# Patient Record
Sex: Female | Born: 1990 | Race: White | Hispanic: No | Marital: Single | Smoking: Never smoker
Health system: Southern US, Community
[De-identification: ages and names within clinical notes are randomized; demographics above are authoritative.]

## PROBLEM LIST (undated history)

## (undated) DIAGNOSIS — J358 Other chronic diseases of tonsils and adenoids: Secondary | ICD-10-CM

## (undated) DIAGNOSIS — N390 Urinary tract infection, site not specified: Secondary | ICD-10-CM

## (undated) DIAGNOSIS — F419 Anxiety disorder, unspecified: Secondary | ICD-10-CM

## (undated) DIAGNOSIS — J3501 Chronic tonsillitis: Secondary | ICD-10-CM

## (undated) DIAGNOSIS — J302 Other seasonal allergic rhinitis: Secondary | ICD-10-CM

## (undated) DIAGNOSIS — Z98811 Dental restoration status: Secondary | ICD-10-CM

## (undated) DIAGNOSIS — F988 Other specified behavioral and emotional disorders with onset usually occurring in childhood and adolescence: Secondary | ICD-10-CM

## (undated) HISTORY — PX: WISDOM TOOTH EXTRACTION: SHX21

---
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
## 2009-04-21 ENCOUNTER — Ambulatory Visit: Payer: Self-pay | Admitting: Family Medicine

## 2009-05-02 ENCOUNTER — Other Ambulatory Visit: Admission: RE | Admit: 2009-05-02 | Discharge: 2009-05-02 | Payer: Self-pay | Admitting: Family Medicine

## 2009-05-02 ENCOUNTER — Encounter: Payer: Self-pay | Admitting: Family Medicine

## 2009-05-02 ENCOUNTER — Ambulatory Visit: Payer: Self-pay | Admitting: Family Medicine

## 2009-05-09 ENCOUNTER — Telehealth: Payer: Self-pay | Admitting: *Deleted

## 2009-06-30 ENCOUNTER — Other Ambulatory Visit: Admission: RE | Admit: 2009-06-30 | Discharge: 2009-06-30 | Payer: Self-pay | Admitting: Family Medicine

## 2009-06-30 ENCOUNTER — Ambulatory Visit: Payer: Self-pay | Admitting: Family Medicine

## 2009-06-30 ENCOUNTER — Encounter: Payer: Self-pay | Admitting: Family Medicine

## 2009-07-05 ENCOUNTER — Telehealth: Payer: Self-pay | Admitting: Family Medicine

## 2009-07-20 ENCOUNTER — Encounter: Payer: Self-pay | Admitting: Family Medicine

## 2009-11-13 ENCOUNTER — Telehealth: Payer: Self-pay | Admitting: Family Medicine

## 2009-11-14 ENCOUNTER — Ambulatory Visit: Payer: Self-pay | Admitting: Family Medicine

## 2009-11-14 DIAGNOSIS — F410 Panic disorder [episodic paroxysmal anxiety] without agoraphobia: Secondary | ICD-10-CM | POA: Insufficient documentation

## 2009-12-07 ENCOUNTER — Ambulatory Visit: Payer: Self-pay | Admitting: Family Medicine

## 2009-12-07 DIAGNOSIS — L559 Sunburn, unspecified: Secondary | ICD-10-CM | POA: Insufficient documentation

## 2010-01-18 ENCOUNTER — Telehealth: Payer: Self-pay | Admitting: Family Medicine

## 2010-03-30 ENCOUNTER — Ambulatory Visit: Payer: Self-pay | Admitting: Family Medicine

## 2010-03-30 DIAGNOSIS — N938 Other specified abnormal uterine and vaginal bleeding: Secondary | ICD-10-CM | POA: Insufficient documentation

## 2010-05-23 ENCOUNTER — Ambulatory Visit: Payer: Self-pay | Admitting: Family Medicine

## 2010-05-31 ENCOUNTER — Other Ambulatory Visit: Admission: RE | Admit: 2010-05-31 | Discharge: 2010-05-31 | Payer: Self-pay | Admitting: Family Medicine

## 2010-05-31 ENCOUNTER — Ambulatory Visit: Payer: Self-pay | Admitting: Family Medicine

## 2010-05-31 LAB — CONVERTED CEMR LAB: Pap Smear: UNDETERMINED

## 2010-06-04 ENCOUNTER — Telehealth: Payer: Self-pay | Admitting: Family Medicine

## 2010-06-05 ENCOUNTER — Telehealth: Payer: Self-pay | Admitting: Family Medicine

## 2010-06-26 ENCOUNTER — Telehealth: Payer: Self-pay | Admitting: Family Medicine

## 2010-07-31 ENCOUNTER — Telehealth: Payer: Self-pay | Admitting: Family Medicine

## 2010-09-28 ENCOUNTER — Other Ambulatory Visit
Admission: RE | Admit: 2010-09-28 | Discharge: 2010-09-28 | Payer: Self-pay | Source: Home / Self Care | Admitting: Family Medicine

## 2010-09-28 ENCOUNTER — Ambulatory Visit
Admission: RE | Admit: 2010-09-28 | Discharge: 2010-09-28 | Payer: Self-pay | Source: Home / Self Care | Attending: Family Medicine | Admitting: Family Medicine

## 2010-09-28 DIAGNOSIS — R87610 Atypical squamous cells of undetermined significance on cytologic smear of cervix (ASC-US): Secondary | ICD-10-CM | POA: Insufficient documentation

## 2010-09-28 LAB — HM PAP SMEAR

## 2010-10-19 ENCOUNTER — Ambulatory Visit
Admission: RE | Admit: 2010-10-19 | Discharge: 2010-10-19 | Payer: Self-pay | Source: Home / Self Care | Attending: Family Medicine | Admitting: Family Medicine

## 2010-10-19 DIAGNOSIS — N390 Urinary tract infection, site not specified: Secondary | ICD-10-CM | POA: Insufficient documentation

## 2010-10-19 LAB — CONVERTED CEMR LAB
Bilirubin Urine: NEGATIVE
Glucose, Urine, Semiquant: NEGATIVE
Specific Gravity, Urine: 1.015
Urobilinogen, UA: 0.2
pH: 7.5

## 2010-10-23 NOTE — Progress Notes (Signed)
Summary: UTI in [place]  Phone Note Call from Patient   Caller: Patient Call For: Roderick Pee MD Summary of Call: Pt is is in [place] with hematuria and dysuria.  Advised to see a MD there for UA and appropriate treatment. Initial call taken by: Lynann Beaver CMA,  June 26, 2010 9:14 AM  Follow-up for Phone Call       Follow-up by: Roderick Pee MD,  June 26, 2010 9:18 AM

## 2010-10-23 NOTE — Progress Notes (Signed)
Summary: ringworm?  Phone Note Call from Patient   Caller: Patient Call For: Roderick Pee MD Summary of Call: [phone] symptoms of ringworm Initial call taken by: Lynann Beaver CMA,  January 18, 2010 3:41 PM  Follow-up for Phone Call        ring shaped patches and itching on body.  Is [facility].  Prob. ringworm..has been around a dog that lives outdoors. Follow-up by: Lynann Beaver CMA,  January 18, 2010 4:23 PM  Additional Follow-up for Phone Call Additional follow up Details #1::        please call have her come to the office this afternoon for evaluation as needed Additional Follow-up by: Roderick Pee MD,  Jan 22, 2010 7:35 AM    Additional Follow-up for Phone Call Additional follow up Details #2::    left message on machine for patient  Follow-up by: Kern Reap CMA Duncan Dull),  Jan 22, 2010 9:07 AM
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
## 2010-10-23 NOTE — Assessment & Plan Note (Signed)
Summary: pap only//ccm   History of Present Illness: Kristie Fitzgerald is a 20 year old single female, who comes in today for a Pap smear because we did her physical last week.  She was having her cycle.  Pelvic exam normal.  Pap smear sent for analysis.  She is also having trouble sleeping dysfunction.  She, states she's taken Tylenol PM for a couple years now is not working.  She denies any history of depression.  She states she just goes to bed, but can't go to sleep when she goes to sleep.  She sleeps ok   She states her mother has the same problem  Allergies: No Known Drug Allergies   Complete Medication List: 1)  Celexa 20 Mg Tabs (Citalopram hydrobromide) .Marland Kitchen.. 1 tab @ bedtime 2)  Ortho-cyclen (28) 0.25-35 Mg-mcg Tabs (Norgestimate-eth estradiol) .... Uad 3)  Amitriptyline Hcl 10 Mg Tabs (Amitriptyline hcl) .Marland Kitchen.. 1 tab @ bedtime  Other Orders: No Charge Patient Arrived (NCPA0) (NCPA0) Prescriptions: AMITRIPTYLINE HCL 10 MG TABS (AMITRIPTYLINE HCL) 1 tab @ bedtime  #100 x 1   Entered and Authorized by:   Roderick Pee MD   Signed by:   Roderick Pee MD on 05/31/2010   Method used:     RxID:   [id number]

## 2010-10-23 NOTE — Assessment & Plan Note (Signed)
Summary: cpx/pap/njr   Vital Signs:  Patient profile:   20 year old female Menstrual status:  regular LMP:     05/17/2010 Height:      67.75 inches Weight:      156 pounds BMI:     23.98 Temp:     97.8 degrees F oral BP sitting:   100 / 70  (left arm) Cuff size:   regular  Vitals Entered By: Kathrynn Speed CMA (May 23, 2010 9:20 AM) CC: cpx,  no pap, src LMP (date): 05/17/2010     Enter LMP: 05/17/2010 Last PAP Result NEGATIVE FOR INTRAEPITHELIAL LESIONS OR MALIGNANCY. Flu Vaccine Consent Questions     Do you have a history of severe allergic reactions to this vaccine? no    Any prior history of allergic reactions to egg and/or gelatin? no    Do you have a sensitivity to the preservative Thimersol? no    Do you have a past history of Guillan-Barre Syndrome? no    Do you currently have an acute febrile illness? no    Have you ever had a severe reaction to latex? no    Vaccine information given and explained to patient? yes    Are you currently pregnant? no    Lot Number:[id number]   Exp Date:03/23/2011   Site Given  Left Deltoid IM   CC:  cpx, no pap, and src.  History of Present Illness: Kristie Fitzgerald is a 20 year old single female, who comes in today for general physical examination  She takes Celexa 20 mg nightly for mild panic disorder..  Doing well.  She also takes her BCPs.  Started the other day.  Not done.  Therefore, have her come back in a week.  Review of systems negative...she does not do BSE monthly advised to do so and both nipples are inverted.  Normal for her  Family history reviewed.  Unchanged........... father has had a melanoma.  Tetanus 2003, seasonal flu shot today.  Meningitis 2009.  She leaves  September, the 13th for 3 months in [place]  Current Medications (verified): 1)  Celexa 20 Mg Tabs (Citalopram Hydrobromide) .Marland Kitchen.. 1 Tab @ Bedtime 2)  Ortho-Cyclen (28) 0.25-35 Mg-Mcg Tabs (Norgestimate-Eth Estradiol) .... Uad  Allergies (verified): No  Known Drug Allergies  Past History:  Past medical, surgical, family and social histories (including risk factors) reviewed, and no changes noted (except as noted below).  Past Medical History: Reviewed history from 06/23/2008 and no changes required. Unremarkable  Family History: Reviewed history from 11/14/2009 and no changes required. father of melanoma mother in good health Family History Depression  Social History: Reviewed history from 12/15/2007 and no changes required. Single Never Smoked Alcohol use-no Drug use-no Regular exercise-yes  Review of Systems      See HPI  Physical Exam  General:  Well-developed,well-nourished,in no acute distress; alert,appropriate and cooperative throughout examination Head:  Normocephalic and atraumatic without obvious abnormalities. No apparent alopecia or balding. Eyes:  No corneal or conjunctival inflammation noted. EOMI. Perrla. Funduscopic exam benign, without hemorrhages, exudates or papilledema. Vision grossly normal. Ears:  External ear exam shows no significant lesions or deformities.  Otoscopic examination reveals clear canals, tympanic membranes are intact bilaterally without bulging, retraction, inflammation or discharge. Hearing is grossly normal bilaterally. Nose:  External nasal examination shows no deformity or inflammation. Nasal mucosa are pink and moist without lesions or exudates. Mouth:  Oral mucosa and oropharynx without lesions or exudates.  Teeth in good repair. Neck:  No deformities, masses, or tenderness noted.  Chest Wall:  No deformities, masses, or tenderness noted. Breasts:  No mass, nodules, thickening, tenderness, bulging, retraction, inflamation, nipple discharge or skin changes noted.   Lungs:  Normal respiratory effort, chest expands symmetrically. Lungs are clear to auscultation, no crackles or wheezes. Heart:  Normal rate and regular rhythm. S1 and S2 normal without gallop, murmur, click, rub or other  extra sounds. Abdomen:  Bowel sounds positive,abdomen soft and non-tender without masses, organomegaly or hernias noted. Genitalia:  Pelvic Exam:        External: normal female genitalia without lesions or masses        Vagina: normal without lesions or masses        Cervix: normal without lesions or masses        Adnexa: normal bimanual exam without masses or fullness.........done a week after physical examination because at the time of her physical.  She was having her menstrual cycle        Uterus: normal by palpation        Pap smear: performed Msk:  No deformity or scoliosis noted of thoracic or lumbar spine.   Pulses:  R and L carotid,radial,femoral,dorsalis pedis and posterior tibial pulses are full and equal bilaterally Extremities:  No clubbing, cyanosis, edema, or deformity noted with normal full range of motion of all joints.   Neurologic:  No cranial nerve deficits noted. Station and gait are normal. Plantar reflexes are down-going bilaterally. DTRs are symmetrical throughout. Sensory, motor and coordinative functions appear intact. Skin:  Intact without suspicious lesions or rashes Cervical Nodes:  No lymphadenopathy noted Axillary Nodes:  No palpable lymphadenopathy Inguinal Nodes:  No significant adenopathy Psych:  Cognition and judgment appear intact. Alert and cooperative with normal attention span and concentration. No apparent delusions, illusions, hallucinations   Impression & Recommendations:  Problem # 1:  DYSFUNCTIONAL UTERINE BLEEDING (ICD-626.8) Assessment Improved  Problem # 2:  PANIC DISORDER (ICD-300.01) Assessment: Improved  Her updated medication list for this problem includes:    Celexa 20 Mg Tabs (Citalopram hydrobromide) .Marland Kitchen... 1 tab @ bedtime  Problem # 3:  Preventive Health Care (ICD-V70.0) Assessment: Unchanged  Complete Medication List: 1)  Celexa 20 Mg Tabs (Citalopram hydrobromide) .Marland Kitchen.. 1 tab @ bedtime 2)  Ortho-cyclen (28) 0.25-35 Mg-mcg Tabs  (Norgestimate-eth estradiol) .... Uad  Other Orders: Admin 1st Vaccine (16109) Flu Vaccine 19yrs + (60454) Prescriptions: ORTHO-CYCLEN (28) 0.25-35 MG-MCG TABS (NORGESTIMATE-ETH ESTRADIOL) UAD  #3 x 1   Entered and Authorized by:   Roderick Pee MD   Signed by:   Roderick Pee MD on 05/23/2010   Method used:   Print then Give to Patient   RxID:   [id number] CELEXA 20 MG TABS (CITALOPRAM HYDROBROMIDE) 1 tab @ bedtime  #100 x 3   Entered and Authorized by:   Roderick Pee MD   Signed by:   Roderick Pee MD on 05/23/2010   Method used:   Print then Give to Patient   RxID:   [id number]    Immunization History:  Tetanus/Td Immunization History:    Tetanus/Td:  historical (06/11/2002)

## 2010-10-23 NOTE — Assessment & Plan Note (Signed)
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  She had anxiety attacks as a teenager and then it went away.  About two months ago.  They started again.  She has episodes where she gets hot, rapid heart rate, sweaty, sometimes to the point of nausea and feeling like vomiting.  She's also having trouble with her 5 roommates.  She has the same boyfriend.  She had last year.  She's on her BCPs periods are normal  Allergies: No Known Drug Allergies  Past History:  Past medical, surgical, family and social histories (including risk factors) reviewed for relevance to current acute and chronic problems.  Past Medical History: Reviewed history from 06/23/2008 and no changes required. Unremarkable  Family History: Reviewed history from 12/15/2007 and no changes required. father of melanoma mother in good health Family History Depression  Social History: Reviewed history from 12/15/2007 and no changes required. Single Never Smoked Alcohol use-no Drug use-no Regular exercise-yes  Review of Systems      See HPI  Physical Exam  General:  Well-developed,well-nourished,in no acute distress; alert,appropriate and cooperative throughout examination Psych:  Cognition and judgment appear intact. Alert and cooperative with normal attention span and concentration. No apparent delusions, illusions, hallucinations   Impression & Recommendations:  Problem # 1:  PANIC DISORDER (ICD-300.01) Assessment New  Her updated medication list  for this problem includes:    Celexa 20 Mg Tabs (Citalopram hydrobromide) .Marland Kitchen... 1 tab @ bedtime  Orders: Prescription Created Electronically [id number])  Complete Medication List: 1)  Zovia 1/35e (28) 1-35 Mg-mcg Tabs (Ethynodiol diac-eth estradiol) .... Uad 2)  Celexa 20 Mg Tabs (Citalopram hydrobromide) .Marland Kitchen.. 1 tab @ bedtime  Patient Instructions: 1)  begin Celexa 20 mg a day at bedtime.  Return in 3 weeks for follow-up............. schedule a 30 minute appointment, so we can also check y skin  Prescriptions: CELEXA 20 MG TABS (CITALOPRAM HYDROBROMIDE) 1 tab @ bedtime  #100 x 3   Entered and Authorized by:   Roderick Pee MD   Signed by:   Roderick Pee MD on 11/14/2009   Method used:   Electronically to        [facility]* (retail)       [address]       [place], [place]  [address]       Ph: [phone]       Fax: [phone]   RxID:   [id number]

## 2010-10-23 NOTE — Progress Notes (Signed)
  Phone Note Outgoing Call   Summary of Call: I called [facility] and explained the nature of the abnormality and the fact that we need to re-check her every 3 months to be sure.  Her cervix completely heals.  She understands and will return in December Initial call taken by: Roderick Pee MD,  June 04, 2010 4:28 PM
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
## 2010-10-23 NOTE — Progress Notes (Signed)
Summary: Pt having anxiety issues. Wants referral to counselor  Call back at [phone] [name] cellphone   Caller: [name] Summary of Call: Pts mom called and said that her daughter said that she would like to talk to a counselor re: anxiety issue. What would Dr. Tawanna Cooler recommend?  Initial call taken by: Lucy Antigua,  November 13, 2009 3:56 PM    Additional Follow-up for Phone Call Additional follow up Details #2::    [name] is having symptoms of panic attacks advised to come to the office tomorrow at 10 a.m. for even a Follow-up by: Roderick Pee MD,  November 13, 2009 5:21 PM

## 2010-10-23 NOTE — Progress Notes (Signed)
Summary: possible strep  Phone Note Call from Patient   Caller: Mom Call For: Roderick Pee MD Reason for Call: Talk to Doctor Summary of Call: patient's mom is calling becaues [name] is calling with a red, swollen, sore throat.  She believes it may be strep.  However Kristie Fitzgerald is in [place] and was wondering if you had any suggestions?  Mom number is [phone] and Kristie Fitzgerald can be reached on her cell phone at [phone]. Initial call taken by: Kern Reap CMA Duncan Dull),  July 31, 2010 10:00 AM  Follow-up for Phone Call        [name] please call mag and then tell her to go to a local urgent care in [place]  for a strep test  Follow-up by: Roderick Pee MD,  July 31, 2010 10:47 AM  Additional Follow-up for Phone Call Additional follow up Details #1::        Phone Call Completed Additional Follow-up by: Kern Reap CMA Duncan Dull),  July 31, 2010 12:45 PM

## 2010-10-25 NOTE — Assessment & Plan Note (Signed)
Summary: 3 week fup---recheck urine/ccm   Vital Signs:  Patient profile:   20 year old female Menstrual status:  regular Weight:      156 pounds Temp:     98.3 degrees F oral BP sitting:   110 / 76  (left arm) Cuff size:   regular  Vitals Entered By: Kern Reap CMA Duncan Dull) (October 19, 2010 4:52 PM) CC: follow-up visit for UTI   CC:  follow-up visit for UTI.  History of Present Illness: Kristie Fitzgerald is a 20 year old single female, who comes in today for evaluation of 3 problems.  We increased her Celexa from 20 mg a day to 40 because she was having issues with anxiety.  She feels much better and wishes to continue that dose.  We gave her Septra for a week because of hematuria.  The hematuria persists, asymptomatic pain.  She thinks she may have cut herself shaving.  However, on physical exam, I can see no cuts, and her urethra looks normal.  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  We discussed the various options.  I explained to her that her to turbid.  This would heal on its down however, we need to monitor her carefully  Allergies: No Known Drug Allergies  Past History:  Past medical, surgical, family and social histories (including risk factors) reviewed for relevance to current acute and chronic problems.  Past Medical History: Reviewed history from 06/23/2008 and no changes required. Unremarkable  Family History: Reviewed history from 11/14/2009 and no changes required. father of melanoma mother in good health Family History Depression  Social History: Reviewed history from 12/15/2007 and no changes required. Single Never Smoked Alcohol use-no Drug use-no Regular exercise-yes  Review of Systems      See HPI  Physical Exam  General:  Well-developed,well-nourished,in no acute distress; alert,appropriate and cooperative throughout examination Genitalia:  extension into within normal limits.  The urethra looks  normal.   Impression & Recommendations:  Problem # 1:  UTI (ICD-599.0) Assessment Unchanged  Her updated medication list for this problem includes:    Septra Ds 800-160 Mg Tabs (Sulfamethoxazole-trimethoprim) .Marland Kitchen... 1 tab @ bedtime x 2 weeks    Ciprofloxacin Hcl 500 Mg Tabs (Ciprofloxacin hcl) .Marland Kitchen... 1 tab @ bedtime  Orders: UA Dipstick w/o Micro (manual) (16109)  Problem # 2:  ASCUS PAP (ICD-795.01) Assessment: Unchanged  Problem # 3:  PANIC DISORDER (ICD-300.01) Assessment: Improved  Her updated medication list for this problem includes:    Celexa 20 Mg Tabs (Citalopram hydrobromide) .Marland Kitchen... 1 tab @ bedtime    Amitriptyline Hcl 10 Mg Tabs (Amitriptyline hcl) .Marland Kitchen... 1 tab @ bedtime    Celexa 40 Mg Tabs (Citalopram hydrobromide) .Marland Kitchen... 1 tab @ bedtime  Complete Medication List: 1)  Celexa 20 Mg Tabs (Citalopram hydrobromide) .Marland Kitchen.. 1 tab @ bedtime 2)  Ortho-cyclen (28) 0.25-35 Mg-mcg Tabs (Norgestimate-eth estradiol) .... Uad 3)  Amitriptyline Hcl 10 Mg Tabs (Amitriptyline hcl) .Marland Kitchen.. 1 tab @ bedtime 4)  Celexa 40 Mg Tabs (Citalopram hydrobromide) .Marland Kitchen.. 1 tab @ bedtime 5)  Septra Ds 800-160 Mg Tabs (Sulfamethoxazole-trimethoprim) .Marland Kitchen.. 1 tab @ bedtime x 2 weeks 6)  Ciprofloxacin Hcl 500 Mg Tabs (Ciprofloxacin hcl) .Marland Kitchen.. 1 tab @ bedtime  Patient Instructions: 1)  take Cipro one tablet daily, x 3 weeks.  Return in 4 weeks for follow-up. 2)  Continue the Celexa 40 mg nightly 3)  Follow-up Pap smear in 6 months Prescriptions: CIPROFLOXACIN HCL 500 MG TABS (  CIPROFLOXACIN HCL) 1 tab @ bedtime  #21 x 0   Entered and Authorized by:   Roderick Pee MD   Signed by:   Roderick Pee MD on 10/19/2010   Method used:   Electronically to        [facility]* (retail)       [address]       [place], [place]  [address]       Ph: [phone]       Fax: [phone]   RxID:   [id number] CIPROFLOXACIN HCL 500 MG TABS (CIPROFLOXACIN HCL) 1 tab @ bedtime  #21 x 0   Entered and  Authorized by:   Roderick Pee MD   Signed by:   Roderick Pee MD on 10/19/2010   Method used:     RxID:   [id number]    Orders Added: 1)  UA Dipstick w/o Micro (manual) [81002] 2)  Est. Patient Level III [38756]    Laboratory Results   Urine Tests  Date/Time Received: October 19, 2010   Routine Urinalysis   Color: yellow Appearance: Clear Glucose: negative   (Normal Range: Negative) Bilirubin: negative   (Normal Range: Negative) Ketone: negative   (Normal Range: Negative) Spec. Gravity: 1.015   (Normal Range: 1.003-1.035) Blood: moderate   (Normal Range: Negative) pH: 7.5   (Normal Range: 5.0-8.0) Protein: negative   (Normal Range: Negative) Urobilinogen: 0.2   (Normal Range: 0-1) Nitrite: negative   (Normal Range: Negative) Leukocyte Esterace: negative   (Normal Range: Negative)    Comments: Kern Reap CMA Duncan Dull)  October 19, 2010 4:58 PM

## 2010-10-25 NOTE — Assessment & Plan Note (Signed)
Summary: pap only per dr//ccm   History of Present Illness: Kristie Fitzgerald is a 20 year old single female, nonsmoker, who comes in today for follow-up Pap smear.  Her Pap smear in September was normal except she had some slight squamous atypia.  Review of systems negative except she recently this past fall spent 3 months in [place].  She did well over there.  She developed a UTI in [place] and then had strep throat in [place].  Both of which were treated and she finished her medication.  Review of systems negative.  With her dad having melanoma.  We will also do a thorough skin exam  She is also having symptoms of frequency that come and go.  No fever, dysuria, etc.  Allergies: No Known Drug Allergies  Past History:  Past medical, surgical, family and social histories (including risk factors) reviewed for relevance to current acute and chronic problems.  Past Medical History: Reviewed history from 06/23/2008 and no changes required. Unremarkable  Family History: Reviewed history from 11/14/2009 and no changes required. father of melanoma mother in good health Family History Depression  Social History: Reviewed history from 12/15/2007 and no changes required. Single Never Smoked Alcohol use-no Drug use-no Regular exercise-yes  Review of Systems      See HPI  Physical Exam  General:  Well-developed,well-nourished,in no acute distress; alert,appropriate and cooperative throughout examination Genitalia:  Pelvic Exam:        External: normal female genitalia without lesions or masses        Vagina: normal without lesions or masses        Cervix: normal without lesions or masses        Adnexa: normal bimanual exam without masses or fullness        Uterus: normal by palpation        Pap smear: performed Skin:  total body skin exam normal   Impression & Recommendations:  Problem # 1:  ASCUS PAP (ICD-795.01) Assessment New  Complete Medication List: 1)  Celexa 20 Mg Tabs  (Citalopram hydrobromide) .Marland Kitchen.. 1 tab @ bedtime 2)  Ortho-cyclen (28) 0.25-35 Mg-mcg Tabs (Norgestimate-eth estradiol) .... Uad 3)  Amitriptyline Hcl 10 Mg Tabs (Amitriptyline hcl) .Marland Kitchen.. 1 tab @ bedtime 4)  Celexa 40 Mg Tabs (Citalopram hydrobromide) .Marland Kitchen.. 1 tab @ bedtime 5)  Septra Ds 800-160 Mg Tabs (Sulfamethoxazole-trimethoprim) .Marland Kitchen.. 1 tab @ bedtime x 2 weeks  Patient Instructions: 1)  I will call you when I get the report. 2)  Increase the Celexa to 40 mg nightly to see if this does not help with her sleep dysfunction. 3)  Begin Septra DS, one daily x 2 weeks...Marland KitchenMarland KitchenMarland Kitchen  Return in 3 weeks to recheck a urine Prescriptions: SEPTRA DS 800-160 MG TABS (SULFAMETHOXAZOLE-TRIMETHOPRIM) 1 tab @ bedtime x 2 weeks  #14 x 0   Entered and Authorized by:   Roderick Pee MD   Signed by:   Roderick Pee MD on 09/28/2010   Method used:     RxID:   [id number] CELEXA 40 MG TABS (CITALOPRAM HYDROBROMIDE) 1 tab @ bedtime  #100 x 3   Entered and Authorized by:   Roderick Pee MD   Signed by:   Roderick Pee MD on 09/28/2010   Method used:     RxID:   [id number]    Orders Added: 1)  Est. Patient Level IV [95284]

## 2010-11-19 ENCOUNTER — Encounter: Payer: Self-pay | Admitting: Family Medicine

## 2010-11-19 ENCOUNTER — Ambulatory Visit (INDEPENDENT_AMBULATORY_CARE_PROVIDER_SITE_OTHER): Payer: Commercial Managed Care - PPO | Admitting: Family Medicine

## 2010-11-19 DIAGNOSIS — F988 Other specified behavioral and emotional disorders with onset usually occurring in childhood and adolescence: Secondary | ICD-10-CM

## 2010-11-19 DIAGNOSIS — R319 Hematuria, unspecified: Secondary | ICD-10-CM

## 2010-11-19 LAB — POCT URINALYSIS DIPSTICK
Leukocytes, UA: NEGATIVE
Nitrite, UA: NEGATIVE
Protein, UA: NEGATIVE
Urobilinogen, UA: 0.2

## 2010-11-19 MED ORDER — LISDEXAMFETAMINE DIMESYLATE 20 MG PO CAPS: 20.0000 mg | ORAL_CAPSULE | ORAL | Status: DC

## 2010-11-19 NOTE — Progress Notes (Signed)
  Subjective:    Patient ID: [name], female    DOB: Jul 09, 1991, 20 y.o.   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  She had a bladder infection and persistent hematuria.  Hematuria, now is resolved.  In the past year.  She is having trouble focusing concentration.  She took one of her friends vyvanse  and it really helped.  Neither mother nor father had ADD or ADHD.  The g PA a slip to 2.9.  She is a premed major psych major and history minor.   Review of Systems    neg  Objective:   Physical Exam    Well-developed well-nourished, female, in no acute distress    Assessment & Plan:  Hematuria resolved.  Question ADD,,,,,,,,,,,,, plan trial of vyvanse   Follow-up in two weeks

## 2010-11-19 NOTE — Patient Instructions (Signed)
Begin vyvanse 20 mg q.a.m. Follow-up in two weeks

## 2010-11-29 ENCOUNTER — Other Ambulatory Visit: Payer: Self-pay | Admitting: Family Medicine

## 2010-12-04 ENCOUNTER — Ambulatory Visit: Payer: Commercial Managed Care - PPO | Admitting: Family Medicine

## 2010-12-21 ENCOUNTER — Encounter: Payer: Self-pay | Admitting: Family Medicine

## 2010-12-24 ENCOUNTER — Ambulatory Visit (INDEPENDENT_AMBULATORY_CARE_PROVIDER_SITE_OTHER): Payer: 59 | Admitting: Family Medicine

## 2010-12-24 VITALS — BP 110/72 | Temp 98.3°F | Wt 156.0 lb

## 2010-12-24 DIAGNOSIS — F909 Attention-deficit hyperactivity disorder, unspecified type: Secondary | ICD-10-CM

## 2010-12-24 MED ORDER — LISDEXAMFETAMINE DIMESYLATE 20 MG PO CAPS: ORAL_CAPSULE | ORAL | Status: DC

## 2010-12-24 MED ORDER — METHYLPHENIDATE HCL 10 MG PO TABS: ORAL_TABLET | ORAL | Status: DC

## 2010-12-24 MED ORDER — LISDEXAMFETAMINE DIMESYLATE 20 MG PO CAPS: 20.0000 mg | ORAL_CAPSULE | ORAL | Status: DC

## 2010-12-24 NOTE — Patient Instructions (Signed)
Continued the morning, medication and add the 10-mg tablet in the afternoon, as needed.  Set up a time in July, mid cycles for general physical exam

## 2010-12-24 NOTE — Progress Notes (Signed)
  Subjective:    Patient ID: [name], female    DOB: 1991/07/01, 20 y.o.   MRN: [id number]  HPI Clemmie is a 20 year old female, nonsmoker, who comes in today for follow-up of ADD.  We start her on vyvanse 20 mg q.a.m., and it's helped her tremendously.  She can now focusing concentrating at her work done.  It tends to wear off after 4 to 5  Hours.She's having breakthrough bleeding from her BCPs.  We discussed how to make it go away.        Review of Systems General and psychiatric review of systems otherwise negative    Objective:   Physical Exam With the primary team in no acute distress       Assessment & Plan:  Adult ADD,,,,,,,, continue the Vyvanse 20 mg q.a.m. Add a 10 mg supplement p.r.n.  Return in July at mid cycle for your annual physical examination

## 2011-01-18 ENCOUNTER — Other Ambulatory Visit: Payer: Self-pay | Admitting: Family Medicine

## 2011-03-12 ENCOUNTER — Telehealth: Payer: Self-pay | Admitting: *Deleted

## 2011-03-12 NOTE — Telephone Encounter (Signed)
Decrease Celexa to 20 mg daily, dispense 100 tabs no refills.  She's coming in August for a checkup or she can take her 62s and just cut them in half

## 2011-03-12 NOTE — Telephone Encounter (Signed)
patient  Is aware and refill not needed at this time

## 2011-03-12 NOTE — Telephone Encounter (Signed)
Pt requesting to have Celexa 40 mg switched to 20 mg dosage.  She has been feeling more anxious and kind of hazy and prefers to switch back to original dosage.  Ok to leave message on voicemail in class until 4pm.

## 2011-03-16 ENCOUNTER — Other Ambulatory Visit: Payer: Self-pay | Admitting: Family Medicine

## 2011-04-04 ENCOUNTER — Encounter: Payer: 59 | Admitting: Family Medicine

## 2011-04-05 ENCOUNTER — Other Ambulatory Visit (HOSPITAL_COMMUNITY)
Admission: RE | Admit: 2011-04-05 | Discharge: 2011-04-05 | Disposition: A | Discharge status: Home / Self Care | Payer: 59 | Source: Ambulatory Visit | Attending: Family Medicine | Admitting: Family Medicine

## 2011-04-05 ENCOUNTER — Ambulatory Visit (INDEPENDENT_AMBULATORY_CARE_PROVIDER_SITE_OTHER): Payer: 59 | Admitting: Family Medicine

## 2011-04-05 ENCOUNTER — Encounter: Payer: Self-pay | Admitting: Family Medicine

## 2011-04-05 VITALS — BP 118/78 | Temp 98.5°F | Ht 67.75 in | Wt 163.0 lb

## 2011-04-05 DIAGNOSIS — N938 Other specified abnormal uterine and vaginal bleeding: Secondary | ICD-10-CM

## 2011-04-05 DIAGNOSIS — N949 Unspecified condition associated with female genital organs and menstrual cycle: Secondary | ICD-10-CM

## 2011-04-05 DIAGNOSIS — N925 Other specified irregular menstruation: Secondary | ICD-10-CM

## 2011-04-05 DIAGNOSIS — Z113 Encounter for screening for infections with a predominantly sexual mode of transmission: Secondary | ICD-10-CM | POA: Insufficient documentation

## 2011-04-05 DIAGNOSIS — Z01419 Encounter for gynecological examination (general) (routine) without abnormal findings: Secondary | ICD-10-CM

## 2011-04-05 DIAGNOSIS — Z124 Encounter for screening for malignant neoplasm of cervix: Secondary | ICD-10-CM | POA: Insufficient documentation

## 2011-04-05 DIAGNOSIS — F909 Attention-deficit hyperactivity disorder, unspecified type: Secondary | ICD-10-CM

## 2011-04-05 MED ORDER — LISDEXAMFETAMINE DIMESYLATE 20 MG PO CAPS: ORAL_CAPSULE | ORAL | Status: DC

## 2011-04-05 MED ORDER — METHYLPHENIDATE HCL 10 MG PO TABS: 10.0000 mg | ORAL_TABLET | Freq: Every day | ORAL | Status: DC

## 2011-04-05 MED ORDER — NORGESTIMATE-ETH ESTRADIOL 0.25-35 MG-MCG PO TABS: 1.0000 | ORAL_TABLET | Freq: Every day | ORAL | Status: DC

## 2011-04-05 MED ORDER — CITALOPRAM HYDROBROMIDE 20 MG PO TABS: 20.0000 mg | ORAL_TABLET | Freq: Every day | ORAL | Status: DC

## 2011-04-05 MED ORDER — LISDEXAMFETAMINE DIMESYLATE 20 MG PO CAPS: 20.0000 mg | ORAL_CAPSULE | ORAL | Status: DC

## 2011-04-05 NOTE — Patient Instructions (Signed)
Continue your current medications.  Follow-up in one year, sooner if any problems.  Sunscreens SPF 50.  Remember to do a breast exam and skin exam monthly

## 2011-04-05 NOTE — Progress Notes (Signed)
  Subjective:    Patient ID: Kristie Fitzgerald, female    DOB: [birth year], 20 y.o.   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  This helps prevent panic attacks.  She also takes vyvanse 20 mg q.a.m. For ADD and 10 mg of plain Ritalin in the afternoon p.r.n.  She takes her BCPs due to normal.  Last period 3 weeks ago.      Review of Systems  Constitutional: Negative.   HENT: Negative.   Eyes: Negative.   Respiratory: Negative.   Cardiovascular: Negative.   Gastrointestinal: Negative.   Genitourinary: Negative.   Musculoskeletal: Negative.   Neurological: Negative.   Hematological: Negative.   Psychiatric/Behavioral: Negative.        Objective:   Physical Exam  Constitutional: She appears well-developed and well-nourished.  HENT:  Head: Normocephalic and atraumatic.  Right Ear: External ear normal.  Left Ear: External ear normal.  Nose: Nose normal.  Mouth/Throat: Oropharynx is clear and moist.  Eyes: EOM are normal. Pupils are equal, round, and reactive to light.  Neck: Normal range of motion. Neck supple. No thyromegaly present.  Cardiovascular: Normal rate, regular rhythm, normal heart sounds and intact distal pulses.  Exam reveals no gallop and no friction rub.   No murmur heard. Pulmonary/Chest: Effort normal and breath sounds normal.  Abdominal: Soft. Bowel sounds are normal. She exhibits no distension and no mass. There is no tenderness. There is no rebound.  Genitourinary: Vagina normal and uterus normal. No vaginal discharge found.       Bilateral breast exam normal  Musculoskeletal: Normal range of motion.  Lymphadenopathy:    She has no cervical adenopathy.  Neurological: She is alert. She has normal reflexes. No cranial nerve deficit. She exhibits normal muscle tone. Coordination normal.  Skin: Skin is warm and dry.    Psychiatric: She has a normal mood and affect. Her behavior is normal. Judgment and thought content normal.          Assessment & Plan:  Healthy female.  History of panic attacks continue Celexa 20 mg daily.  History of ADD.  Continue current medications.  Dysfunctional uterine bleeding.  Continue BCPs.  Wear sunscreens SPF 50 because of her life.  Skin light.  Eyes and family history of skin cancer and the dural breast and skin exam monthly.  Return one year, sooner if any problem

## 2011-08-16 ENCOUNTER — Other Ambulatory Visit: Payer: Self-pay | Admitting: Family Medicine

## 2011-08-16 DIAGNOSIS — F909 Attention-deficit hyperactivity disorder, unspecified type: Secondary | ICD-10-CM

## 2011-08-16 NOTE — Telephone Encounter (Signed)
Pt called req 3 month script for methylphenidate (RITALIN) 10 MG tablet and a 1 month script for lisdexamfetamine (VYVANSE) 20 MG capsule

## 2011-08-19 MED ORDER — METHYLPHENIDATE HCL 10 MG PO TABS: ORAL_TABLET | ORAL | Status: DC

## 2011-08-19 MED ORDER — METHYLPHENIDATE HCL 10 MG PO TABS: 10.0000 mg | ORAL_TABLET | Freq: Every day | ORAL | Status: DC

## 2011-08-19 MED ORDER — LISDEXAMFETAMINE DIMESYLATE 20 MG PO CAPS: ORAL_CAPSULE | ORAL | Status: DC

## 2011-08-19 NOTE — Telephone Encounter (Signed)
rx ready for pick up and patient is aware  

## 2011-10-10 ENCOUNTER — Telehealth: Payer: Self-pay | Admitting: *Deleted

## 2011-10-10 NOTE — Telephone Encounter (Signed)
Noted in chart.

## 2011-10-10 NOTE — Telephone Encounter (Signed)
Stepped on nail last night and is wanting to know if she needs to come for Tetanus injection.

## 2011-10-10 NOTE — Telephone Encounter (Signed)
Good until September but is welcome to come in for a booster

## 2011-10-10 NOTE — Telephone Encounter (Signed)
Pt got a TDAP at an Urgent Care today.

## 2011-10-25 ENCOUNTER — Telehealth: Payer: Self-pay | Admitting: *Deleted

## 2011-10-25 NOTE — Telephone Encounter (Signed)
Kristie Fitzgerald, pt needs to be called about a lost prescription of Vyvanse????

## 2011-10-28 ENCOUNTER — Telehealth: Payer: Self-pay | Admitting: *Deleted

## 2011-10-28 DIAGNOSIS — F909 Attention-deficit hyperactivity disorder, unspecified type: Secondary | ICD-10-CM

## 2011-10-28 NOTE — Telephone Encounter (Signed)
Kristie Fitzgerald please call 

## 2011-10-28 NOTE — Telephone Encounter (Signed)
(  triage voicemail)Message for [name]:  Pt states her mom lost rx, Mom gets meds filled at [facility].  Please call me back or you can call parents if need be.

## 2011-10-28 NOTE — Telephone Encounter (Signed)
Left message on machine asking patient to call back with more information

## 2011-10-29 MED ORDER — LISDEXAMFETAMINE DIMESYLATE 20 MG PO CAPS: ORAL_CAPSULE | ORAL | Status: DC

## 2011-10-29 MED ORDER — LISDEXAMFETAMINE DIMESYLATE 20 MG PO CAPS: 20.0000 mg | ORAL_CAPSULE | ORAL | Status: DC

## 2011-10-29 NOTE — Telephone Encounter (Signed)
ok 

## 2011-10-29 NOTE — Telephone Encounter (Signed)
Rx ready for pick up and patient is aware 

## 2011-10-30 NOTE — Telephone Encounter (Signed)
rx ready for pick up and patient is aware  

## 2011-12-09 ENCOUNTER — Telehealth: Payer: Self-pay

## 2011-12-09 MED ORDER — NORETHIN ACE-ETH ESTRAD-FE 1-20 MG-MCG(24) PO TABS: 1.0000 | ORAL_TABLET | Freq: Every day | ORAL | Status: DC

## 2011-12-09 NOTE — Telephone Encounter (Signed)
Pt states she would like to change her birth control pills because it has been changed at the pharmacy.  Pt states that she was getting Tri-Sprintec but now it has changed to a generic form with no name.  Pt states she does not like the new generic medication and it is causing her to have PMS symptoms such as very bad cramps and heavy menstrual flow as well as skin breakouts.  Pt would like to change to a new birth control medication.  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 Pt is aware Dr. Tawanna Cooler is out of the office for the entire week.  Pt would like a new rx sent to [facility].  Pls advise.

## 2012-03-10 ENCOUNTER — Other Ambulatory Visit (HOSPITAL_COMMUNITY)
Admission: RE | Admit: 2012-03-10 | Discharge: 2012-03-10 | Disposition: A | Discharge status: Home / Self Care | Payer: 59 | Source: Ambulatory Visit | Attending: Family Medicine | Admitting: Family Medicine

## 2012-03-10 ENCOUNTER — Ambulatory Visit (INDEPENDENT_AMBULATORY_CARE_PROVIDER_SITE_OTHER): Payer: 59 | Admitting: Family Medicine

## 2012-03-10 ENCOUNTER — Encounter: Payer: Self-pay | Admitting: Family Medicine

## 2012-03-10 VITALS — BP 110/78 | Temp 98.6°F | Ht 68.0 in | Wt 167.0 lb

## 2012-03-10 DIAGNOSIS — F32A Depression, unspecified: Secondary | ICD-10-CM

## 2012-03-10 DIAGNOSIS — Z01419 Encounter for gynecological examination (general) (routine) without abnormal findings: Secondary | ICD-10-CM

## 2012-03-10 DIAGNOSIS — F329 Major depressive disorder, single episode, unspecified: Secondary | ICD-10-CM

## 2012-03-10 DIAGNOSIS — R87610 Atypical squamous cells of undetermined significance on cytologic smear of cervix (ASC-US): Secondary | ICD-10-CM

## 2012-03-10 MED ORDER — NORETHIN ACE-ETH ESTRAD-FE 1-20 MG-MCG(24) PO TABS: 1.0000 | ORAL_TABLET | Freq: Every day | ORAL | Status: DC

## 2012-03-10 MED ORDER — CITALOPRAM HYDROBROMIDE 20 MG PO TABS: 20.0000 mg | ORAL_TABLET | Freq: Every day | ORAL | Status: DC

## 2012-03-10 NOTE — Patient Instructions (Signed)
Set up an appointment ASAP to remove the black lesion from behind her right leg  Taper the Celexa by taking a half a tablet daily for 2 weeks then a half a tablet Monday Wednesday Friday for 2 weeks then stop  Continue to BCPs  Followup in 1 year sooner if any problems  I will call you the report on your Pap

## 2012-03-10 NOTE — Progress Notes (Signed)
  Subjective:    Patient ID: Kristie Fitzgerald, female    DOB: 10-13-1990, 21 y.o.   MRN: [id number]  HPI Kristie Fitzgerald is a 21 year old single female recent graduate of [employer] who comes in today for general physical examination  She takes Celexa 20 mg daily would like to taper off that she feels emotionally well  She takes her BCPs daily periods are normal     Review of Systems  Constitutional: Negative.   HENT: Negative.   Eyes: Negative.   Respiratory: Negative.   Cardiovascular: Negative.   Gastrointestinal: Negative.   Genitourinary: Negative.   Musculoskeletal: Negative.   Neurological: Negative.   Hematological: Negative.   Psychiatric/Behavioral: Negative.        Objective:   Physical Exam  Constitutional: She appears well-developed and well-nourished.  HENT:  Head: Normocephalic and atraumatic.  Right Ear: External ear normal.  Left Ear: External ear normal.  Nose: Nose normal.  Mouth/Throat: Oropharynx is clear and moist.  Eyes: EOM are normal. Pupils are equal, round, and reactive to light.  Neck: Normal range of motion. Neck supple. No thyromegaly present.  Cardiovascular: Normal rate, regular rhythm, normal heart sounds and intact distal pulses.  Exam reveals no gallop and no friction rub.   No murmur heard. Pulmonary/Chest: Effort normal and breath sounds normal.  Abdominal: Soft. Bowel sounds are normal. She exhibits no distension and no mass. There is no tenderness. There is no rebound.  Genitourinary: Vagina normal and uterus normal. No vaginal discharge found.       Bilateral breast exam shows multiple fibrocystic changes no dominant mass  Musculoskeletal: Normal range of motion.  Lymphadenopathy:    She has no cervical adenopathy.  Neurological: She is alert. She has normal reflexes. No cranial nerve deficit. She exhibits normal muscle tone. Coordination normal.  Skin: Skin is warm and dry.       She has a birthmark on her left breast total  body skin exam normal except for a black lesion posterior right thigh advised to return ASAP for removal  Psychiatric: She has a normal mood and affect. Her behavior is normal. Judgment and thought content normal.          Assessment & Plan:  Healthy female  History of depression taper Celexa  Dysfunction uterine bleeding continue BCPs  History of abnormal Pap  Abnormal mole right posterior thigh return for removal ASAP

## 2012-03-11 ENCOUNTER — Ambulatory Visit (INDEPENDENT_AMBULATORY_CARE_PROVIDER_SITE_OTHER): Payer: 59 | Admitting: Family Medicine

## 2012-03-11 ENCOUNTER — Encounter: Payer: Self-pay | Admitting: Family Medicine

## 2012-03-11 DIAGNOSIS — D2360 Other benign neoplasm of skin of unspecified upper limb, including shoulder: Secondary | ICD-10-CM

## 2012-03-11 NOTE — Patient Instructions (Signed)
Within 2 weeks we will call you the report  Meticulous use of sunscreens SPF 50+  Check your freckles and moles monthly at home

## 2012-03-11 NOTE — Progress Notes (Signed)
  Subjective:    Patient ID: Kristie Fitzgerald, female    DOB: 1991-02-07, 21 y.o.   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  After informed consent the lesion was anesthetized with 1% Xylocaine with epinephrine removed with 3 mm margins. The base was cauterized the lesion was sent for pathologic analysis. Band-Aid was applied. She tolerated the procedure no complications.  Review of Systems General and dermatologic review of systems otherwise negative    Objective:   Physical Exam Procedure see above       Assessment & Plan:  Dysplastic nevus clinically past pending

## 2012-05-18 ENCOUNTER — Telehealth: Payer: Self-pay | Admitting: Family Medicine

## 2012-05-18 NOTE — Telephone Encounter (Signed)
°  Caller: Katrenia/Patient; Patient Name: Kristie Fitzgerald; PCP: Kelle Darting [facility]); Best Callback Phone Number: [phone].  She started on Loestrin 3-4 months ago and 7/13 "messed up pills".  This month she has taken all her pills correctly this month 04/2012.  Now she is more emotional, cannot lose weight and has started her period early last week for a few days.  She wants to know when she will start her next period.  Will start on inert pills 05/24/12 and spotted 05/11/12 thru 05/13/12. No bleeding now. Triaged Vaginal Bleeding, Premenopausal, Abnormal and all emergent symptoms ruled out. Need to have home care provided.    Instructed  to keep a menstural journal  and continue to take pills through this pack and see when her period startsan how heavy her flow, etc. Call back instructions given.

## 2012-05-26 ENCOUNTER — Other Ambulatory Visit (INDEPENDENT_AMBULATORY_CARE_PROVIDER_SITE_OTHER): Payer: 59

## 2012-05-26 DIAGNOSIS — Z0000 Encounter for general adult medical examination without abnormal findings: Secondary | ICD-10-CM

## 2012-05-26 LAB — CBC WITH DIFFERENTIAL/PLATELET
Basophils Absolute: 0 10*3/uL (ref 0.0–0.1)
Basophils Relative: 0.7 % (ref 0.0–3.0)
Eosinophils Absolute: 0.1 10*3/uL (ref 0.0–0.7)
Hemoglobin: 12.4 g/dL (ref 12.0–15.0)
Lymphocytes Relative: 36 % (ref 12.0–46.0)
Lymphs Abs: 2.4 10*3/uL (ref 0.7–4.0)
MCHC: 33.1 g/dL (ref 30.0–36.0)
MCV: 91.2 fl (ref 78.0–100.0)
Monocytes Absolute: 0.5 10*3/uL (ref 0.1–1.0)
Neutro Abs: 3.7 10*3/uL (ref 1.4–7.7)
RBC: 4.12 Mil/uL (ref 3.87–5.11)
RDW: 13.3 % (ref 11.5–14.6)

## 2012-05-26 LAB — POCT URINALYSIS DIPSTICK
Bilirubin, UA: NEGATIVE
Ketones, UA: NEGATIVE
Leukocytes, UA: NEGATIVE
Spec Grav, UA: 1.02
pH, UA: 7

## 2012-05-26 LAB — HEPATIC FUNCTION PANEL
Albumin: 3.8 g/dL (ref 3.5–5.2)
Total Protein: 6.7 g/dL (ref 6.0–8.3)

## 2012-05-26 LAB — BASIC METABOLIC PANEL
CO2: 25 mEq/L (ref 19–32)
Calcium: 9.3 mg/dL (ref 8.4–10.5)
Chloride: 106 mEq/L (ref 96–112)
Glucose, Bld: 78 mg/dL (ref 70–99)
Sodium: 140 mEq/L (ref 135–145)

## 2012-05-26 LAB — LIPID PANEL
HDL: 72.9 mg/dL (ref >39.00)
VLDL: 22.8 mg/dL (ref 0.0–40.0)

## 2012-05-28 ENCOUNTER — Telehealth: Payer: Self-pay | Admitting: *Deleted

## 2012-05-28 NOTE — Telephone Encounter (Signed)
Loestrin 24 Fe tablet has been discontinued by the manufacturer.  Would you like to substitute?

## 2012-05-29 MED ORDER — LEVONORGESTREL-ETHINYL ESTRAD 0.1-20 MG-MCG PO TABS: 1.0000 | ORAL_TABLET | Freq: Every day | ORAL | Status: DC

## 2012-06-01 NOTE — Telephone Encounter (Signed)
She's coming in tomorrow we'll do this tomorrow

## 2012-06-02 ENCOUNTER — Encounter: Payer: Self-pay | Admitting: Family Medicine

## 2012-06-02 ENCOUNTER — Ambulatory Visit (INDEPENDENT_AMBULATORY_CARE_PROVIDER_SITE_OTHER): Payer: 59 | Admitting: Family Medicine

## 2012-06-02 VITALS — BP 110/70 | Temp 98.1°F | Ht 68.5 in | Wt 162.0 lb

## 2012-06-02 DIAGNOSIS — N6019 Diffuse cystic mastopathy of unspecified breast: Secondary | ICD-10-CM

## 2012-06-02 DIAGNOSIS — N949 Unspecified condition associated with female genital organs and menstrual cycle: Secondary | ICD-10-CM

## 2012-06-02 DIAGNOSIS — D2360 Other benign neoplasm of skin of unspecified upper limb, including shoulder: Secondary | ICD-10-CM

## 2012-06-02 DIAGNOSIS — Z0000 Encounter for general adult medical examination without abnormal findings: Secondary | ICD-10-CM

## 2012-06-02 DIAGNOSIS — N938 Other specified abnormal uterine and vaginal bleeding: Secondary | ICD-10-CM

## 2012-06-02 MED ORDER — ETONOGESTREL-ETHINYL ESTRADIOL 0.12-0.015 MG/24HR VA RING: VAGINAL_RING | VAGINAL | Status: DC

## 2012-06-02 NOTE — Progress Notes (Signed)
  Subjective:    Patient ID: Kristie Fitzgerald, female    DOB: 1991-03-11, 21 y.o.   MRN: [id number]  HPI Kristie Fitzgerald is a 21 year old single female nonsmoker who comes in today for general physical examination  She's always been in good health she's had no chronic health problems. We did remove a atypical nevus from her left thigh in June. It's healed well no free pigmentation.  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   HENT: Negative.   Eyes: Negative.   Respiratory: Negative.   Cardiovascular: Negative.   Gastrointestinal: Negative.   Genitourinary: Negative.   Musculoskeletal: Negative.   Neurological: Negative.   Hematological: Negative.   Psychiatric/Behavioral: Negative.        Objective:   Physical Exam  Constitutional: She appears well-developed and well-nourished.  HENT:  Head: Normocephalic and atraumatic.  Right Ear: External ear normal.  Left Ear: External ear normal.  Nose: Nose normal.  Mouth/Throat: Oropharynx is clear and moist.  Eyes: EOM are normal. Pupils are equal, round, and reactive to light.  Neck: Normal range of motion. Neck supple. No thyromegaly present.  Cardiovascular: Normal rate, regular rhythm, normal heart sounds and intact distal pulses.  Exam reveals no gallop and no friction rub.   No murmur heard. Pulmonary/Chest: Effort normal and breath sounds normal.  Abdominal: Soft. Bowel sounds are normal. She exhibits no distension and no mass. There is no tenderness. There is no rebound.  Genitourinary:       Bilateral breast exam shows multiple small fibrocystic lesions throughout both breasts no dominant lesion BSE was taught  Musculoskeletal: Normal range  of motion.  Lymphadenopathy:    She has no cervical adenopathy.  Neurological: She is alert. She has normal reflexes. No cranial nerve deficit. She exhibits normal muscle tone. Coordination normal.  Skin: Skin is warm and dry.       Total body skin exam normal the lesion we removed in June his well-healed no re\re pigmentation  Psychiatric: She has a normal mood and affect. Her behavior is normal. Judgment and thought content normal.          Assessment & Plan:  Healthy female  Dysfunction uterine bleeding switch to NuvaRing  History of abnormal moles father had a melanoma continue sunscreens monthly surveillance at home followup in June

## 2012-06-02 NOTE — Patient Instructions (Signed)
Insert the first NuvaRing on Friday  Return on Monday for followup  Remember to continue wearing your SPF 50+ sunscreens and do a thorough breast and skin exam monthly

## 2012-06-08 ENCOUNTER — Encounter: Payer: Self-pay | Admitting: Family Medicine

## 2012-06-08 ENCOUNTER — Ambulatory Visit (INDEPENDENT_AMBULATORY_CARE_PROVIDER_SITE_OTHER): Payer: 59 | Admitting: Family Medicine

## 2012-06-08 DIAGNOSIS — N938 Other specified abnormal uterine and vaginal bleeding: Secondary | ICD-10-CM

## 2012-06-08 DIAGNOSIS — N949 Unspecified condition associated with female genital organs and menstrual cycle: Secondary | ICD-10-CM

## 2012-06-08 NOTE — Progress Notes (Signed)
  Subjective:    Patient ID: Kristie Fitzgerald, female    DOB: August 23, 1991, 21 y.o.   MRN: [id number]  HPI Kristie Fitzgerald is a 21 year old female who comes in today to check to see if her NuvaRing is in the right position   Review of Systems    review of systems otherwise negative Objective:   Physical Exam  Pelvic exam shows a NuvaRing in excellent position      Assessment & Plan:  Continue NuvaRing for

## 2012-06-08 NOTE — Patient Instructions (Signed)
Return in January for a skin exam because of a family history of melanoma

## 2012-07-31 ENCOUNTER — Telehealth: Payer: Self-pay | Admitting: Family Medicine

## 2012-07-31 NOTE — Telephone Encounter (Signed)
Caller: Tamsyn/Patient; Patient Name: Kristie Fitzgerald; PCP: Kelle Darting [facility]); Best Callback Phone Number: [phone]   07-30-12  She was recently started on Nuva Ring and she said she loves the hormone levels but she has started dating someone and he said it hurts.  She said she has it in properly.  She has one more month of Nuva Ring per insurance but would like to go back on a pill. She had been on Loestren and said she hated how that made her feel.

## 2012-08-03 NOTE — Telephone Encounter (Signed)
Left message on machine for patient

## 2012-08-03 NOTE — Telephone Encounter (Signed)
[name] ,,,,,,,,,, please call Kienna........ her partner should not feel the NuvaRing. Have her come in and let us recheck it at that time we will also discuss her options

## 2012-08-18 ENCOUNTER — Encounter: Payer: Self-pay | Admitting: Family Medicine

## 2012-08-18 ENCOUNTER — Ambulatory Visit (INDEPENDENT_AMBULATORY_CARE_PROVIDER_SITE_OTHER): Payer: 59 | Admitting: Family Medicine

## 2012-08-18 VITALS — BP 102/68 | Temp 98.5°F | Wt 172.0 lb

## 2012-08-18 DIAGNOSIS — N949 Unspecified condition associated with female genital organs and menstrual cycle: Secondary | ICD-10-CM

## 2012-08-18 DIAGNOSIS — N390 Urinary tract infection, site not specified: Secondary | ICD-10-CM | POA: Insufficient documentation

## 2012-08-18 DIAGNOSIS — N938 Other specified abnormal uterine and vaginal bleeding: Secondary | ICD-10-CM

## 2012-08-18 LAB — POCT URINALYSIS DIPSTICK
Bilirubin, UA: NEGATIVE
Glucose, UA: NEGATIVE
Ketones, UA: NEGATIVE
Spec Grav, UA: 1.01

## 2012-08-18 MED ORDER — SULFAMETHOXAZOLE-TRIMETHOPRIM 800-160 MG PO TABS: ORAL_TABLET | ORAL | Status: DC

## 2012-08-18 MED ORDER — LEVONORGESTREL-ETHINYL ESTRAD 0.1-20 MG-MCG PO TABS: 1.0000 | ORAL_TABLET | Freq: Every day | ORAL | Status: DC

## 2012-08-18 NOTE — Patient Instructions (Signed)
Septra one twice daily  Restart the [facility]  Return in June for followup sooner if any problems

## 2012-08-18 NOTE — Progress Notes (Signed)
  Subjective:    Patient ID: Kristie Fitzgerald, female    DOB: November 27, 1990, 21 y.o.   MRN: [id number]  HPI Kristie Fitzgerald is a 21 year old single female nonsmoker who comes in today to discuss birth control and a possible UTI  She's been on the NuvaRing however would like to switch back to the [facility]  Today she started having some urinary frequency. Her last UTI was 4 years ago   Review of Systems Gen. review of systems otherwise negative    Objective:   Physical Exam  Well-developed well-nourished female in no acute distress abdominal exam negative  Urinalysis shows small amount of blood trace of white cells      Assessment & Plan:  UTI Septra twice a day x3 days  Restart BCPs  Return in June for followup sooner if any problems

## 2012-08-18 NOTE — Addendum Note (Signed)
Addended by: Kern Reap B on: 08/18/2012 05:23 PM   Modules accepted: Orders

## 2012-10-08 ENCOUNTER — Telehealth: Payer: Self-pay | Admitting: Family Medicine

## 2012-10-08 ENCOUNTER — Ambulatory Visit: Payer: 59 | Admitting: Family Medicine

## 2012-10-08 NOTE — Telephone Encounter (Signed)
Patient called stating that she thinks she has a uti and would like to have some abx called into [facility]. Please assist.

## 2012-10-08 NOTE — Telephone Encounter (Signed)
Patient has taken AZO feeling better.  She will call back if needed

## 2012-10-09 ENCOUNTER — Ambulatory Visit (INDEPENDENT_AMBULATORY_CARE_PROVIDER_SITE_OTHER): Payer: 59 | Admitting: Family Medicine

## 2012-10-09 ENCOUNTER — Encounter: Payer: Self-pay | Admitting: Family Medicine

## 2012-10-09 VITALS — BP 110/70 | Temp 98.5°F | Wt 168.0 lb

## 2012-10-09 DIAGNOSIS — N390 Urinary tract infection, site not specified: Secondary | ICD-10-CM

## 2012-10-09 LAB — POCT URINALYSIS DIPSTICK
Bilirubin, UA: NEGATIVE
Spec Grav, UA: 1.02

## 2012-10-09 MED ORDER — SULFAMETHOXAZOLE-TRIMETHOPRIM 800-160 MG PO TABS: ORAL_TABLET | ORAL | Status: DC

## 2012-10-09 NOTE — Progress Notes (Signed)
  Subjective:    Patient ID: Kristie Fitzgerald, female    DOB: 1991-02-09, 22 y.o.   MRN: [id number]  HPI  Onset yesterday urine frequency and burning and some urgency. No gross hematuria  No fever.  No nausea or vomiting No back pain. Azostandard has helped symptoms. Sexually active with one partner. She thinks this may be a risk factor. No vaginal discharge. No history of STD   Review of Systems  Constitutional: Negative for fever, chills and appetite change.  Gastrointestinal: Negative for nausea, vomiting, abdominal pain, diarrhea and constipation.  Genitourinary: Positive for dysuria and frequency.  Musculoskeletal: Negative for back pain.  Neurological: Negative for dizziness.       Objective:   Physical Exam  Constitutional: She appears well-developed and well-nourished.  HENT:  Head: Normocephalic and atraumatic.  Neck: Neck supple. No thyromegaly present.  Cardiovascular: Normal rate, regular rhythm and normal heart sounds.   Pulmonary/Chest: Breath sounds normal.  Abdominal: Soft. Bowel sounds are normal. There is no tenderness.          Assessment & Plan:  Uncomplicated cystitis. Septra DS one twice a day for 3 days. Plenty of fluids. Empty bladder promptly after intercourse

## 2012-10-09 NOTE — Patient Instructions (Addendum)
Urinary Tract Infection Urinary tract infections (UTIs) can develop anywhere along your urinary tract. Your urinary tract is your body's drainage system for removing wastes and extra water. Your urinary tract includes two kidneys, two ureters, a bladder, and a urethra. Your kidneys are a pair of bean-shaped organs. Each kidney is about the size of your fist. They are located below your ribs, one on each side of your spine. CAUSES Infections are caused by microbes, which are microscopic organisms, including fungi, viruses, and bacteria. These organisms are so small that they can only be seen through a microscope. Bacteria are the microbes that most commonly cause UTIs. SYMPTOMS  Symptoms of UTIs may vary by age and gender of the patient and by the location of the infection. Symptoms in young women typically include a frequent and intense urge to urinate and a painful, burning feeling in the bladder or urethra during urination. Older women and men are more likely to be tired, shaky, and weak and have muscle aches and abdominal pain. A fever may mean the infection is in your kidneys. Other symptoms of a kidney infection include pain in your back or sides below the ribs, nausea, and vomiting. DIAGNOSIS To diagnose a UTI, your caregiver will ask you about your symptoms. Your caregiver also will ask to provide a urine sample. The urine sample will be tested for bacteria and white blood cells. White blood cells are made by your body to help fight infection. TREATMENT  Typically, UTIs can be treated with medication. Because most UTIs are caused by a bacterial infection, they usually can be treated with the use of antibiotics. The choice of antibiotic and length of treatment depend on your symptoms and the type of bacteria causing your infection. HOME CARE INSTRUCTIONS  If you were prescribed antibiotics, take them exactly as your caregiver instructs you. Finish the medication even if you feel better after you  have only taken some of the medication.  Drink enough water and fluids to keep your urine clear or pale yellow.  Avoid caffeine, tea, and carbonated beverages. They tend to irritate your bladder.  Empty your bladder often. Avoid holding urine for long periods of time.  Empty your bladder before and after sexual intercourse.  After a bowel movement, women should cleanse from front to back. Use each tissue only once. SEEK MEDICAL CARE IF:   You have back pain.  You develop a fever.  Your symptoms do not begin to resolve within 3 days. SEEK IMMEDIATE MEDICAL CARE IF:   You have severe back pain or lower abdominal pain.  You develop chills.  You have nausea or vomiting.  You have continued burning or discomfort with urination. MAKE SURE YOU:   Understand these instructions.  Will watch your condition.  Will get help right away if you are not doing well or get worse. Document Released: 06/19/2005 Document Revised: 03/10/2012 Document Reviewed: 10/18/2011 [employer] Patient Information 2013 [employer].  

## 2012-10-27 ENCOUNTER — Ambulatory Visit (INDEPENDENT_AMBULATORY_CARE_PROVIDER_SITE_OTHER): Payer: 59 | Admitting: Family Medicine

## 2012-10-27 ENCOUNTER — Encounter: Payer: Self-pay | Admitting: Family Medicine

## 2012-10-27 VITALS — BP 110/80 | Temp 98.7°F | Wt 168.0 lb

## 2012-10-27 DIAGNOSIS — N938 Other specified abnormal uterine and vaginal bleeding: Secondary | ICD-10-CM

## 2012-10-27 DIAGNOSIS — N949 Unspecified condition associated with female genital organs and menstrual cycle: Secondary | ICD-10-CM

## 2012-10-27 DIAGNOSIS — N390 Urinary tract infection, site not specified: Secondary | ICD-10-CM

## 2012-10-27 LAB — POCT URINALYSIS DIPSTICK
Bilirubin, UA: NEGATIVE
Ketones, UA: NEGATIVE
Leukocytes, UA: NEGATIVE
Nitrite, UA: POSITIVE

## 2012-10-27 LAB — POCT HEMOGLOBIN: Hemoglobin: 13 g/dL (ref 12.2–16.2)

## 2012-10-27 MED ORDER — SULFAMETHOXAZOLE-TRIMETHOPRIM 800-160 MG PO TABS: ORAL_TABLET | ORAL | Status: DC

## 2012-10-27 MED ORDER — NITROFURANTOIN MONOHYD MACRO 100 MG PO CAPS: ORAL_CAPSULE | ORAL | Status: DC

## 2012-10-27 NOTE — Addendum Note (Signed)
Addended by: Kern Reap B on: 10/27/2012 10:20 AM   Modules accepted: Orders

## 2012-10-27 NOTE — Patient Instructions (Addendum)
Septra one twice daily for 1 week then 1 at bedtime for 2 weeks  1 Macrobid prior to intercourse  If you developed any urinary tract symptoms call and come in for urine culture  Remember to drink lots of water  We will e-mail and a change in your birth control pills

## 2012-10-27 NOTE — Progress Notes (Signed)
  Subjective:    Patient ID: Kristie Fitzgerald, female    DOB: [birth year], 22 y.o.   MRN: [id number]  HPI Kristie Fitzgerald is a 22 year old single female nonsmoker who comes in today for evaluation of 2 problems  She was seen here about 10 days ago with a urinary tract infection and was given Septra for 3 days. Her symptoms abated but then came back. She restart the Septra this past Sunday no fever chills or back pain.  She's had 5 urinary tract infections in the last 12 months most of which we think are sexually related.  She also thinks he's having side effects from her new birth control pills of nausea   Review of Systems    review of systems otherwise negative Objective:   Physical Exam Well-developed and nourished female no acute distress examination the abdomen is normal urinalysis shows 2+ blood nitrate positive leukocytes positive       Assessment & Plan:  Your tract infection partially treated plan Septra twice a day for one week then one daily for 2 weeks then Macrobid with intercourse  Change BCPs to a lower dose pill

## 2013-02-23 ENCOUNTER — Encounter: Payer: Self-pay | Admitting: Family Medicine

## 2013-02-23 ENCOUNTER — Ambulatory Visit (INDEPENDENT_AMBULATORY_CARE_PROVIDER_SITE_OTHER): Payer: 59 | Admitting: Family Medicine

## 2013-02-23 VITALS — BP 108/70 | Temp 98.3°F | Wt 163.0 lb

## 2013-02-23 DIAGNOSIS — F329 Major depressive disorder, single episode, unspecified: Secondary | ICD-10-CM

## 2013-02-23 DIAGNOSIS — F32A Depression, unspecified: Secondary | ICD-10-CM

## 2013-02-23 DIAGNOSIS — D1779 Benign lipomatous neoplasm of other sites: Secondary | ICD-10-CM

## 2013-02-23 DIAGNOSIS — D171 Benign lipomatous neoplasm of skin and subcutaneous tissue of trunk: Secondary | ICD-10-CM | POA: Insufficient documentation

## 2013-02-23 MED ORDER — CITALOPRAM HYDROBROMIDE 20 MG PO TABS: 20.0000 mg | ORAL_TABLET | Freq: Every day | ORAL | Status: DC

## 2013-02-23 NOTE — Patient Instructions (Signed)
Call and make an appointment to see Dr. Emelia Loron,,,,,,, general surgeon  Restart the Celexa  Return before going to grad school for your annual physical

## 2013-02-23 NOTE — Progress Notes (Signed)
  Subjective:    Patient ID: Kristie Fitzgerald, female    DOB: 03-03-1991, 22 y.o.   MRN: [id number]  HPI Kristie Fitzgerald is a 22 year old female single nonsmoker who is going to grad school in August who comes in today for evaluation of back pain and depression  She has been on Celexa in the past and this really helped her mood. She is in a difficult boyfriend relationship right now and has noticed some mood swings and would like to restart her Celexa  She also has a back pain. She points to her right SI joint as his cause of her pain. She says it comes and goes. She states she saw a PA in the emergency room where she is described and was told she has sciatica   Review of Systems Review of systems negative no history of back trauma    Objective:   Physical Exam  Well-developed well nourished female no acute distress examination the spine is normal there is a takes size lipoma adjacent to the right SI joint      Assessment & Plan:  Lipoma right SI joint plan Gen. Consult for evaluation  Mood changes restart Celexa

## 2013-03-08 ENCOUNTER — Other Ambulatory Visit (INDEPENDENT_AMBULATORY_CARE_PROVIDER_SITE_OTHER): Payer: 59

## 2013-03-08 DIAGNOSIS — Z0000 Encounter for general adult medical examination without abnormal findings: Secondary | ICD-10-CM

## 2013-03-08 LAB — LIPID PANEL
HDL: 59.1 mg/dL (ref >39.00)
LDL Cholesterol: 86 mg/dL (ref 0–99)
Total CHOL/HDL Ratio: 3
VLDL: 15.6 mg/dL (ref 0.0–40.0)

## 2013-03-08 LAB — CBC WITH DIFFERENTIAL/PLATELET
Basophils Relative: 0.9 % (ref 0.0–3.0)
HCT: 39.4 % (ref 36.0–46.0)
Hemoglobin: 13.2 g/dL (ref 12.0–15.0)
Lymphocytes Relative: 38.8 % (ref 12.0–46.0)
Lymphs Abs: 3 10*3/uL (ref 0.7–4.0)
Monocytes Relative: 6.2 % (ref 3.0–12.0)
Neutro Abs: 3.5 10*3/uL (ref 1.4–7.7)
RBC: 4.33 Mil/uL (ref 3.87–5.11)

## 2013-03-08 LAB — BASIC METABOLIC PANEL
CO2: 26 mEq/L (ref 19–32)
Calcium: 9 mg/dL (ref 8.4–10.5)
GFR: 131.38 mL/min (ref >60.00)
Potassium: 4.1 mEq/L (ref 3.5–5.1)
Sodium: 138 mEq/L (ref 135–145)

## 2013-03-08 LAB — HEPATIC FUNCTION PANEL
Albumin: 4 g/dL (ref 3.5–5.2)
Alkaline Phosphatase: 45 U/L (ref 39–117)
Total Protein: 7 g/dL (ref 6.0–8.3)

## 2013-03-08 LAB — POCT URINALYSIS DIPSTICK
Leukocytes, UA: NEGATIVE
Nitrite, UA: NEGATIVE
Protein, UA: NEGATIVE
Urobilinogen, UA: 0.2
pH, UA: 7

## 2013-03-09 ENCOUNTER — Encounter (INDEPENDENT_AMBULATORY_CARE_PROVIDER_SITE_OTHER): Payer: Self-pay | Admitting: General Surgery

## 2013-03-09 ENCOUNTER — Ambulatory Visit (INDEPENDENT_AMBULATORY_CARE_PROVIDER_SITE_OTHER): Payer: Commercial Managed Care - PPO | Admitting: General Surgery

## 2013-03-09 ENCOUNTER — Ambulatory Visit (INDEPENDENT_AMBULATORY_CARE_PROVIDER_SITE_OTHER): Payer: 59 | Admitting: Licensed Clinical Social Worker

## 2013-03-09 VITALS — BP 102/68 | HR 60 | Temp 97.6°F | Resp 14 | Ht 69.0 in | Wt 160.0 lb

## 2013-03-09 DIAGNOSIS — D1779 Benign lipomatous neoplasm of other sites: Secondary | ICD-10-CM

## 2013-03-09 DIAGNOSIS — F4322 Adjustment disorder with anxiety: Secondary | ICD-10-CM

## 2013-03-09 DIAGNOSIS — D171 Benign lipomatous neoplasm of skin and subcutaneous tissue of trunk: Secondary | ICD-10-CM

## 2013-03-09 NOTE — Progress Notes (Signed)
Patient ID: Kristie Fitzgerald, female   DOB: 19-Nov-1990, 22 y.o.   MRN: [id number]  Chief Complaint  Patient presents with  . New Evaluation    eval back lipoma    HPI Kristie Fitzgerald is a 22 y.o. female.  Referred by Dr Alonza Smoker HPI This is a 22 year old female who is otherwise healthy who presents with about a 5-6 month history of pain in her right lower back associated with the mass. This mass is a little bit bigger. It has become more painful over this time period she complains of some back symptoms radiating from this mass. It has no history of infection. It has never drained. She's never had any procedures on it. History reviewed. No pertinent past medical history.  History reviewed. No pertinent past surgical history.  Family History  Problem Relation Age of Onset  . Cancer Father     meloanoma  . Depression Other     Social History History  Substance Use Topics  . Smoking status: Never Smoker   . Smokeless tobacco: Not on file  . Alcohol Use: Yes    No Known Allergies  Current Outpatient Prescriptions  Medication Sig Dispense Refill  . citalopram (CELEXA) 20 MG tablet Take 1 tablet (20 mg total) by mouth daily.  100 tablet  3  . lisdexamfetamine (VYVANSE) 20 MG capsule Take 20 mg by mouth every morning.      . methylphenidate (RITALIN) 10 MG tablet One at 3 p.m.      Marland Kitchen nitrofurantoin, macrocrystal-monohydrate, (MACROBID) 100 MG capsule One tablet prior to intercourse  30 capsule  3  . norgestrel-ethinyl estradiol (LO/OVRAL,CRYSELLE) 0.3-30 MG-MCG tablet Take 1 tablet by mouth daily.       No current facility-administered medications for this visit.    Review of Systems Review of Systems  Constitutional: Negative for fever, chills and unexpected weight change.  HENT: Negative for hearing loss, congestion, sore throat, trouble swallowing and voice change.   Eyes: Negative for visual disturbance.  Respiratory: Negative for cough and wheezing.   Cardiovascular:  Negative for chest pain, palpitations and leg swelling.  Gastrointestinal: Negative for nausea, vomiting, abdominal pain, diarrhea, constipation, blood in stool, abdominal distention and anal bleeding.  Genitourinary: Negative for hematuria, vaginal bleeding and difficulty urinating.  Musculoskeletal: Negative for arthralgias.  Skin: Negative for rash and wound.  Neurological: Negative for seizures, syncope and headaches.  Hematological: Negative for adenopathy. Does not bruise/bleed easily.  Psychiatric/Behavioral: Negative for confusion.    Blood pressure 102/68, pulse 60, temperature 97.6 F (36.4 C), temperature source Temporal, resp. rate 14, height 5\' 9"  (1.753 m), weight 160 lb (72.576 kg).  Physical Exam Physical Exam  Vitals reviewed. Constitutional: She appears well-developed and well-nourished.  Cardiovascular: Normal rate, regular rhythm and normal heart sounds.   Pulmonary/Chest: Effort normal and breath sounds normal. She has no wheezes. She has no rales.  Musculoskeletal:       Back:    Data Reviewed Notes from Dr Tawanna Cooler  Assessment    Back mass     Plan    Excision of back mass  This area symptomatic and she would like it excised. I agree with that. We discussed risks including bleeding, infection, and slight risk of recurrence. We discussed her time at work. We'll get this done as soon as possible.       Tymier Lindholm 03/09/2013, 3:41 PM

## 2013-03-11 ENCOUNTER — Telehealth (INDEPENDENT_AMBULATORY_CARE_PROVIDER_SITE_OTHER): Payer: Self-pay

## 2013-03-11 NOTE — Telephone Encounter (Signed)
LMOM with pt's f/u appt with Dr Dwain Sarna after surgery on 03/22/13. The pt will see [place] on 04/06/13 arrive at 11:10 for an 11:20.

## 2013-03-12 ENCOUNTER — Encounter (HOSPITAL_COMMUNITY): Payer: Self-pay | Admitting: Pharmacy Technician

## 2013-03-16 ENCOUNTER — Ambulatory Visit (INDEPENDENT_AMBULATORY_CARE_PROVIDER_SITE_OTHER): Payer: 59 | Admitting: Licensed Clinical Social Worker

## 2013-03-16 DIAGNOSIS — F4322 Adjustment disorder with anxiety: Secondary | ICD-10-CM

## 2013-03-18 ENCOUNTER — Ambulatory Visit (INDEPENDENT_AMBULATORY_CARE_PROVIDER_SITE_OTHER): Payer: 59 | Admitting: Family Medicine

## 2013-03-18 ENCOUNTER — Encounter: Payer: Self-pay | Admitting: Family Medicine

## 2013-03-18 ENCOUNTER — Encounter (HOSPITAL_COMMUNITY): Payer: Self-pay

## 2013-03-18 ENCOUNTER — Other Ambulatory Visit (HOSPITAL_COMMUNITY)
Admission: RE | Admit: 2013-03-18 | Discharge: 2013-03-18 | Disposition: A | Discharge status: Home / Self Care | Payer: 59 | Source: Ambulatory Visit | Attending: Family Medicine | Admitting: Family Medicine

## 2013-03-18 ENCOUNTER — Encounter (HOSPITAL_COMMUNITY)
Admission: RE | Admit: 2013-03-18 | Discharge: 2013-03-18 | Disposition: A | Discharge status: Home / Self Care | Payer: 59 | Source: Ambulatory Visit | Attending: General Surgery | Admitting: General Surgery

## 2013-03-18 VITALS — BP 110/68 | Temp 98.5°F | Ht 68.0 in | Wt 161.0 lb

## 2013-03-18 DIAGNOSIS — Z01419 Encounter for gynecological examination (general) (routine) without abnormal findings: Secondary | ICD-10-CM

## 2013-03-18 DIAGNOSIS — F32A Depression, unspecified: Secondary | ICD-10-CM

## 2013-03-18 DIAGNOSIS — F329 Major depressive disorder, single episode, unspecified: Secondary | ICD-10-CM

## 2013-03-18 DIAGNOSIS — F909 Attention-deficit hyperactivity disorder, unspecified type: Secondary | ICD-10-CM

## 2013-03-18 DIAGNOSIS — G4700 Insomnia, unspecified: Secondary | ICD-10-CM

## 2013-03-18 DIAGNOSIS — N938 Other specified abnormal uterine and vaginal bleeding: Secondary | ICD-10-CM

## 2013-03-18 DIAGNOSIS — N949 Unspecified condition associated with female genital organs and menstrual cycle: Secondary | ICD-10-CM

## 2013-03-18 DIAGNOSIS — N6019 Diffuse cystic mastopathy of unspecified breast: Secondary | ICD-10-CM

## 2013-03-18 DIAGNOSIS — Z0000 Encounter for general adult medical examination without abnormal findings: Secondary | ICD-10-CM

## 2013-03-18 DIAGNOSIS — N390 Urinary tract infection, site not specified: Secondary | ICD-10-CM

## 2013-03-18 HISTORY — DX: Urinary tract infection, site not specified: N39.0

## 2013-03-18 HISTORY — DX: Anxiety disorder, unspecified: F41.9

## 2013-03-18 LAB — SURGICAL PCR SCREEN
MRSA, PCR: NEGATIVE
Staphylococcus aureus: POSITIVE — AB

## 2013-03-18 MED ORDER — NITROFURANTOIN MONOHYD MACRO 100 MG PO CAPS: ORAL_CAPSULE | ORAL | Status: DC

## 2013-03-18 MED ORDER — METHYLPHENIDATE HCL 10 MG PO TABS: 10.0000 mg | ORAL_TABLET | Freq: Every day | ORAL | Status: DC | PRN

## 2013-03-18 MED ORDER — METHYLPHENIDATE HCL 10 MG PO TABS: 10.0000 mg | ORAL_TABLET | Freq: Two times a day (BID) | ORAL | Status: DC

## 2013-03-18 MED ORDER — LISDEXAMFETAMINE DIMESYLATE 20 MG PO CAPS: 20.0000 mg | ORAL_CAPSULE | ORAL | Status: DC

## 2013-03-18 MED ORDER — TRAZODONE HCL 50 MG PO TABS: ORAL_TABLET | ORAL | Status: DC

## 2013-03-18 MED ORDER — CITALOPRAM HYDROBROMIDE 20 MG PO TABS: 20.0000 mg | ORAL_TABLET | Freq: Every day | ORAL | Status: DC

## 2013-03-18 MED ORDER — NORGESTREL-ETHINYL ESTRADIOL 0.3-30 MG-MCG PO TABS: 1.0000 | ORAL_TABLET | Freq: Every day | ORAL | Status: DC

## 2013-03-18 NOTE — Patient Instructions (Signed)
Continue your current medications  Call 2 weeks prior to being out of your third prescription for refills  Sunscreens SPF 50+  Followup in 1 year sooner if any problems

## 2013-03-18 NOTE — Patient Instructions (Addendum)
Kristie Fitzgerald  03/18/2013                           YOUR PROCEDURE IS SCHEDULED ON: 03/22/13               PLEASE REPORT TO SHORT STAY CENTER AT :  5:30 AM               CALL THIS NUMBER IF ANY PROBLEMS THE DAY OF SURGERY :               [phone]                      REMEMBER:   Do not eat food or drink liquids AFTER MIDNIGHT   Take these medicines the morning of surgery with A SIP OF WATER: NONE   Do not wear jewelry, make-up   Do not wear lotions, powders, or perfumes.   Do not shave legs or underarms 12 hrs. before surgery (men may shave face)  Do not bring valuables to the hospital.  Contacts, dentures or bridgework may not be worn into surgery.  Leave suitcase in the car. After surgery it may be brought to your room.  For patients admitted to the hospital more than one night, checkout time is 11:00                          The day of discharge.   Patients discharged the day of surgery will not be allowed to drive home                             If going home same day of surgery, must have someone stay with you first                           24 hrs at home and arrange for some one to drive you home from hospital.    Special Instructions:   Please read over the following fact sheets that you were given:               1. MRSA  INFORMATION                      2. [facility] PREPARING FOR SURGERY SHEET               3. STOP ALL ASPIRIN AND HERBAL MEDS 5 DAYS PREOP                                                X_____________________________________________________________________        Failure to follow these instructions may result in cancellation of your surgery

## 2013-03-18 NOTE — Progress Notes (Signed)
  Subjective:    Patient ID: Kristie Fitzgerald, female    DOB: 1991/04/30, 22 y.o.   MRN: [id number]  HPI Kristie Fitzgerald is a 22 year old single female nonsmoker who is going to [employer] this August  She takes Celexa 20 mg daily for her history of mild depression  She also takes vyvanse 20 mg daily because of a history of adult ADD  She takes Macrobid and one tablet prior to intercourse because of frequent urinary tract infections. By taking the Macrobid or urinary tract infections have stopped  She also takes her BCPs for dysfunction uterine bleeding last period was June 20 to the 23rd normal. She also has a history of fibrocystic breast changes and is not doing a breast exam a monthly  She's currently seeing Kristie Fitzgerald for some counseling related to some personal issues with relationships  She's due to have the lesion in her right posterior hip removed by Dr. Dwain Sarna on the 15th. It feels like is a lipoma  She's been meticulous about wearing her sunscreens. Her father had a melanoma   Review of Systems  Constitutional: Negative.   HENT: Negative.   Eyes: Negative.   Respiratory: Negative.   Cardiovascular: Negative.   Gastrointestinal: Negative.   Genitourinary: Negative.   Musculoskeletal: Negative.   Neurological: Negative.   Psychiatric/Behavioral: Negative.        Objective:   Physical Exam  Constitutional: She appears well-developed and well-nourished.  HENT:  Head: Normocephalic and atraumatic.  Right Ear: External ear normal.  Left Ear: External ear normal.  Nose: Nose normal.  Mouth/Throat: Oropharynx is clear and moist.  Eyes: EOM are normal. Pupils are equal, round, and reactive to light.  Neck: Normal range of motion. Neck supple. No thyromegaly present.  Cardiovascular: Normal rate, regular rhythm, normal heart sounds and intact distal pulses.  Exam reveals no gallop and no friction rub.   No murmur heard. Pulmonary/Chest: Effort normal and breath sounds normal.   Abdominal: Soft. Bowel sounds are normal. She exhibits no distension and no mass. There is no tenderness. There is no rebound.  Genitourinary: Vagina normal and uterus normal. Guaiac negative stool. No vaginal discharge found.  Bilateral breast exam normal shows multiple fibrocystic changes throughout both breasts. The largest one is a marble sized lesion in the right breast at the 9:00 position 2 inches from the nipple its soft rubbery movable and has been palpated previously in that area  Musculoskeletal: Normal range of motion.  Lymphadenopathy:    She has no cervical adenopathy.  Neurological: She is alert. She has normal reflexes. No cranial nerve deficit. She exhibits normal muscle tone. Coordination normal.  Skin: Skin is warm and dry.  Total body skin exam normal except for the lesion on her posterior right hip which will be removed by Dr. Cleophas Dunker  Psychiatric: She has a normal mood and affect. Her behavior is normal. Judgment and thought content normal.          Assessment & Plan:  Healthy female  History of mild depression continue Celexa  History of ADD restart medication when she goes to school  History of recurrent urinary tract infections continue Macrobid one tablet prior to intercourse  Dysfunction uterine bleeding continue BCPs  Relationship issues continue working with Kristie Fitzgerald  Lipoma?????right posterior hip surgical removal by Dr. Cleophas Dunker in July  Sleep dysfunction,,,,,,,,,, trazodone 50 mg one half tab each bedtime when necessary

## 2013-03-22 ENCOUNTER — Encounter (HOSPITAL_COMMUNITY): Payer: Self-pay | Admitting: Anesthesiology

## 2013-03-22 ENCOUNTER — Ambulatory Visit (HOSPITAL_COMMUNITY)
Admission: RE | Admit: 2013-03-22 | Discharge: 2013-03-22 | Disposition: A | Discharge status: Home / Self Care | Payer: 59 | Source: Ambulatory Visit | Attending: General Surgery | Admitting: General Surgery

## 2013-03-22 ENCOUNTER — Encounter (HOSPITAL_COMMUNITY): Payer: Self-pay | Admitting: *Deleted

## 2013-03-22 ENCOUNTER — Ambulatory Visit (HOSPITAL_COMMUNITY): Payer: 59 | Admitting: Anesthesiology

## 2013-03-22 ENCOUNTER — Encounter (HOSPITAL_COMMUNITY)
Admission: RE | Disposition: A | Discharge status: Home / Self Care | Payer: Self-pay | Source: Ambulatory Visit | Attending: General Surgery

## 2013-03-22 DIAGNOSIS — D1779 Benign lipomatous neoplasm of other sites: Secondary | ICD-10-CM | POA: Insufficient documentation

## 2013-03-22 DIAGNOSIS — D1739 Benign lipomatous neoplasm of skin and subcutaneous tissue of other sites: Secondary | ICD-10-CM

## 2013-03-22 DIAGNOSIS — Z79899 Other long term (current) drug therapy: Secondary | ICD-10-CM | POA: Insufficient documentation

## 2013-03-22 HISTORY — PX: MASS EXCISION: SHX2000

## 2013-03-22 SURGERY — EXCISION MASS
Anesthesia: General | Site: Back | Wound class: Clean

## 2013-03-22 MED ORDER — CEFAZOLIN SODIUM-DEXTROSE 2-3 GM-% IV SOLR
2.0000 g | INTRAVENOUS | Status: AC
  Administered 2013-03-22: 2 g via INTRAVENOUS

## 2013-03-22 MED ORDER — LACTATED RINGERS IV SOLN
INTRAVENOUS | Status: DC | PRN
  Administered 2013-03-22 (×2): via INTRAVENOUS

## 2013-03-22 MED ORDER — ONDANSETRON HCL 4 MG/2ML IJ SOLN: 4.0000 mg | Freq: Four times a day (QID) | INTRAMUSCULAR | Status: DC | PRN

## 2013-03-22 MED ORDER — MIDAZOLAM HCL 5 MG/5ML IJ SOLN
INTRAMUSCULAR | Status: DC | PRN
  Administered 2013-03-22: 2 mg via INTRAVENOUS

## 2013-03-22 MED ORDER — KETOROLAC TROMETHAMINE 15 MG/ML IJ SOLN: 15.0000 mg | Freq: Four times a day (QID) | INTRAMUSCULAR | Status: DC

## 2013-03-22 MED ORDER — SODIUM CHLORIDE 0.9 % IJ SOLN: 3.0000 mL | Freq: Two times a day (BID) | INTRAMUSCULAR | Status: DC

## 2013-03-22 MED ORDER — SODIUM CHLORIDE 0.9 % IJ SOLN: 3.0000 mL | INTRAMUSCULAR | Status: DC | PRN

## 2013-03-22 MED ORDER — FENTANYL CITRATE 0.05 MG/ML IJ SOLN
INTRAMUSCULAR | Status: AC
  Filled 2013-03-22: qty 2

## 2013-03-22 MED ORDER — ACETAMINOPHEN 650 MG RE SUPP
650.0000 mg | RECTAL | Status: DC | PRN
  Filled 2013-03-22: qty 1

## 2013-03-22 MED ORDER — OXYCODONE HCL 5 MG PO TABS: 5.0000 mg | ORAL_TABLET | ORAL | Status: DC | PRN

## 2013-03-22 MED ORDER — OXYCODONE-ACETAMINOPHEN 5-325 MG PO TABS: 1.0000 | ORAL_TABLET | ORAL | Status: DC | PRN

## 2013-03-22 MED ORDER — ACETAMINOPHEN 325 MG PO TABS: 650.0000 mg | ORAL_TABLET | ORAL | Status: DC | PRN

## 2013-03-22 MED ORDER — BUPIVACAINE HCL (PF) 0.25 % IJ SOLN
INTRAMUSCULAR | Status: AC
  Filled 2013-03-22: qty 30

## 2013-03-22 MED ORDER — ONDANSETRON HCL 4 MG/2ML IJ SOLN
INTRAMUSCULAR | Status: DC | PRN
  Administered 2013-03-22: 4 mg via INTRAVENOUS

## 2013-03-22 MED ORDER — PROMETHAZINE HCL 25 MG/ML IJ SOLN
6.2500 mg | INTRAMUSCULAR | Status: DC | PRN
  Administered 2013-03-22: 6.25 mg via INTRAVENOUS

## 2013-03-22 MED ORDER — CEFAZOLIN SODIUM-DEXTROSE 2-3 GM-% IV SOLR
INTRAVENOUS | Status: AC
  Filled 2013-03-22: qty 50

## 2013-03-22 MED ORDER — PROPOFOL 10 MG/ML IV BOLUS
INTRAVENOUS | Status: DC | PRN
  Administered 2013-03-22: 160 mg via INTRAVENOUS

## 2013-03-22 MED ORDER — DEXAMETHASONE SODIUM PHOSPHATE 10 MG/ML IJ SOLN
INTRAMUSCULAR | Status: DC | PRN
  Administered 2013-03-22: 10 mg via INTRAVENOUS

## 2013-03-22 MED ORDER — LACTATED RINGERS IV SOLN: INTRAVENOUS | Status: DC

## 2013-03-22 MED ORDER — FENTANYL CITRATE 0.05 MG/ML IJ SOLN
INTRAMUSCULAR | Status: DC | PRN
  Administered 2013-03-22: 50 ug via INTRAVENOUS
  Administered 2013-03-22: 100 ug via INTRAVENOUS

## 2013-03-22 MED ORDER — KETOROLAC TROMETHAMINE 30 MG/ML IJ SOLN
15.0000 mg | Freq: Once | INTRAMUSCULAR | Status: AC | PRN
  Administered 2013-03-22: 30 mg via INTRAVENOUS

## 2013-03-22 MED ORDER — MORPHINE SULFATE 10 MG/ML IJ SOLN: 2.0000 mg | INTRAMUSCULAR | Status: DC | PRN

## 2013-03-22 MED ORDER — SODIUM CHLORIDE 0.9 % IV SOLN: 250.0000 mL | INTRAVENOUS | Status: DC | PRN

## 2013-03-22 MED ORDER — SODIUM CHLORIDE 0.9 % IV SOLN: INTRAVENOUS | Status: DC

## 2013-03-22 MED ORDER — FENTANYL CITRATE 0.05 MG/ML IJ SOLN
25.0000 ug | INTRAMUSCULAR | Status: DC | PRN
  Administered 2013-03-22: 50 ug via INTRAVENOUS

## 2013-03-22 MED ORDER — KETOROLAC TROMETHAMINE 30 MG/ML IJ SOLN
INTRAMUSCULAR | Status: AC
  Filled 2013-03-22: qty 1

## 2013-03-22 MED ORDER — BUPIVACAINE HCL (PF) 0.25 % IJ SOLN
INTRAMUSCULAR | Status: DC | PRN
  Administered 2013-03-22: 6 mL
  Administered 2013-03-22: 10 mL

## 2013-03-22 MED ORDER — PROMETHAZINE HCL 25 MG/ML IJ SOLN
INTRAMUSCULAR | Status: AC
  Filled 2013-03-22: qty 1

## 2013-03-22 MED ORDER — SUCCINYLCHOLINE CHLORIDE 20 MG/ML IJ SOLN
INTRAMUSCULAR | Status: DC | PRN
  Administered 2013-03-22: 100 mg via INTRAVENOUS

## 2013-03-22 SURGICAL SUPPLY — 32 items
BLADE SURG 15 STRL LF DISP TIS (BLADE) ×1 IMPLANT
BLADE SURG 15 STRL SS (BLADE) ×1
BLADE SURG ROTATE 9660 (MISCELLANEOUS) IMPLANT
CANISTER SUCTION 1200CC (MISCELLANEOUS) IMPLANT
CHLORAPREP W/TINT 26ML (MISCELLANEOUS) ×2 IMPLANT
CLOTH BEACON ORANGE TIMEOUT ST (SAFETY) ×2 IMPLANT
DECANTER SPIKE VIAL GLASS SM (MISCELLANEOUS) IMPLANT
DERMABOND ADVANCED (GAUZE/BANDAGES/DRESSINGS) ×1
DERMABOND ADVANCED .7 DNX12 (GAUZE/BANDAGES/DRESSINGS) ×1 IMPLANT
DRAPE PED LAPAROTOMY (DRAPES) ×2 IMPLANT
ELECT REM PT RETURN 9FT ADLT (ELECTROSURGICAL) ×2
ELECTRODE REM PT RTRN 9FT ADLT (ELECTROSURGICAL) ×1 IMPLANT
GLOVE BIO SURGEON STRL SZ7 (GLOVE) ×4 IMPLANT
GLOVE BIOGEL PI IND STRL 7.5 (GLOVE) ×2 IMPLANT
GLOVE BIOGEL PI INDICATOR 7.5 (GLOVE) ×2
GOWN PREVENTION PLUS XLARGE (GOWN DISPOSABLE) ×2 IMPLANT
KIT BASIN OR (CUSTOM PROCEDURE TRAY) ×2 IMPLANT
NEEDLE HYPO 25X1 1.5 SAFETY (NEEDLE) ×2 IMPLANT
NS IRRIG 1000ML POUR BTL (IV SOLUTION) ×2 IMPLANT
PACK BASIC VI WITH GOWN DISP (CUSTOM PROCEDURE TRAY) IMPLANT
PENCIL BUTTON HOLSTER BLD 10FT (ELECTRODE) ×2 IMPLANT
SPONGE GAUZE 4X4 12PLY (GAUZE/BANDAGES/DRESSINGS) IMPLANT
STRIP CLOSURE SKIN 1/2X4 (GAUZE/BANDAGES/DRESSINGS) ×2 IMPLANT
SUT MNCRL AB 4-0 PS2 18 (SUTURE) ×2 IMPLANT
SUT VIC AB 3-0 SH 27 (SUTURE) ×2
SUT VIC AB 3-0 SH 27XBRD (SUTURE) ×2 IMPLANT
SYR CONTROL 10ML LL (SYRINGE) ×2 IMPLANT
TOWEL OR 17X26 10 PK STRL BLUE (TOWEL DISPOSABLE) ×4 IMPLANT
TOWEL OR NON WOVEN STRL DISP B (DISPOSABLE) ×2 IMPLANT
WATER STERILE IRR 1000ML POUR (IV SOLUTION) ×2 IMPLANT
YANKAUER SUCT BULB TIP 10FT TU (MISCELLANEOUS) IMPLANT
YANKAUER SUCT BULB TIP NO VENT (SUCTIONS) IMPLANT

## 2013-03-22 NOTE — Op Note (Signed)
Preoperative diagnosis: 4 x 3 cm subfascial back mass Postoperative diagnosis: Same as above Procedure: Excision of 4 x 3 cm subfascial back mass Surgeon: Dr. Harden Mo Anesthesia: Gen. Specimens: Back mass to pathology Drains: None Estimated blood loss: Minimal Complications: None Sponge and needle count correct at end of operation Disposition to recovery stable  Indications: This a 22 year old female with a mass overlying the right sacroiliac joint that has become painful and is causing difficulties with sitting. She presented and would like this excised. On her exam she had about a 4 x 3 cm mass that was tender to palpation it appears to be a lipoma. We discussed the risks and benefits of excising this. We specifically discussed that this may not relieve the pain in that area.  Procedure: After informed consent was obtained the patient was taken to the operating room. She was given 2 g of intravenous cefazolin. Sequential compression devices were on her legs. She was placed anesthesia and rolled into the prone position and appropriately padded. She was then prepped and draped in the standard sterile surgical fashion. A surgical timeout was then performed.  I infiltrated Marcaine throughout the region of the mass. I then made a 3 cm incision. I carried this up to the fascia. I thenremoved what appeared to be a very large lipoma that was subfascial in nature. This was adherent to her sacrum. This was removed with cautery. I then removed this in its entirety and passed off the table as a specimen. Hemostasis was observed. I then closed in several layers with 3-0 Vicryl, 4-0 Monocryl, and Dermabond. I placed Steri-Strips overlying this. She tolerated this well was extubated and transferred to recovery stable.

## 2013-03-22 NOTE — Anesthesia Preprocedure Evaluation (Signed)
Anesthesia Evaluation  Patient identified by MRN, date of birth, ID band Patient awake    Reviewed: Allergy & Precautions, H&P , NPO status , Patient's Chart, lab work & pertinent test results  Airway Mallampati: II TM Distance: <3 FB Neck ROM: Full    Dental no notable dental hx.    Pulmonary neg pulmonary ROS,  breath sounds clear to auscultation  Pulmonary exam normal       Cardiovascular negative cardio ROS  Rhythm:Regular Rate:Normal     Neuro/Psych Anxiety Depression negative neurological ROS     GI/Hepatic negative GI ROS, Neg liver ROS,   Endo/Other  negative endocrine ROS  Renal/GU negative Renal ROS  negative genitourinary   Musculoskeletal negative musculoskeletal ROS (+)   Abdominal   Peds negative pediatric ROS (+)  Hematology negative hematology ROS (+)   Anesthesia Other Findings   Reproductive/Obstetrics negative OB ROS                           Anesthesia Physical Anesthesia Plan  ASA: II  Anesthesia Plan: General   Post-op Pain Management:    Induction: Intravenous  Airway Management Planned: Oral ETT  Additional Equipment:   Intra-op Plan:   Post-operative Plan: Extubation in OR  Informed Consent: I have reviewed the patients History and Physical, chart, labs and discussed the procedure including the risks, benefits and alternatives for the proposed anesthesia with the patient or authorized representative who has indicated his/her understanding and acceptance.   Dental advisory given  Plan Discussed with: CRNA and Surgeon  Anesthesia Plan Comments:         Anesthesia Quick Evaluation

## 2013-03-22 NOTE — H&P (View-Only) (Signed)
Patient ID: MAELANI YARBRO, female   DOB: 03/01/1991, 22 y.o.   MRN: [id number]  Chief Complaint  Patient presents with  . New Evaluation    eval back lipoma    HPI ASHLA MURPH is a 22 y.o. female.  Referred by Dr Alonza Smoker HPI This is a 22 year old female who is otherwise healthy who presents with about a 5-6 month history of pain in her right lower back associated with the mass. This mass is a little bit bigger. It has become more painful over this time period she complains of some back symptoms radiating from this mass. It has no history of infection. It has never drained. She's never had any procedures on it. History reviewed. No pertinent past medical history.  History reviewed. No pertinent past surgical history.  Family History  Problem Relation Age of Onset  . Cancer Father     meloanoma  . Depression Other     Social History History  Substance Use Topics  . Smoking status: Never Smoker   . Smokeless tobacco: Not on file  . Alcohol Use: Yes    No Known Allergies  Current Outpatient Prescriptions  Medication Sig Dispense Refill  . citalopram (CELEXA) 20 MG tablet Take 1 tablet (20 mg total) by mouth daily.  100 tablet  3  . lisdexamfetamine (VYVANSE) 20 MG capsule Take 20 mg by mouth every morning.      . methylphenidate (RITALIN) 10 MG tablet One at 3 p.m.      Marland Kitchen nitrofurantoin, macrocrystal-monohydrate, (MACROBID) 100 MG capsule One tablet prior to intercourse  30 capsule  3  . norgestrel-ethinyl estradiol (LO/OVRAL,CRYSELLE) 0.3-30 MG-MCG tablet Take 1 tablet by mouth daily.       No current facility-administered medications for this visit.    Review of Systems Review of Systems  Constitutional: Negative for fever, chills and unexpected weight change.  HENT: Negative for hearing loss, congestion, sore throat, trouble swallowing and voice change.   Eyes: Negative for visual disturbance.  Respiratory: Negative for cough and wheezing.   Cardiovascular:  Negative for chest pain, palpitations and leg swelling.  Gastrointestinal: Negative for nausea, vomiting, abdominal pain, diarrhea, constipation, blood in stool, abdominal distention and anal bleeding.  Genitourinary: Negative for hematuria, vaginal bleeding and difficulty urinating.  Musculoskeletal: Negative for arthralgias.  Skin: Negative for rash and wound.  Neurological: Negative for seizures, syncope and headaches.  Hematological: Negative for adenopathy. Does not bruise/bleed easily.  Psychiatric/Behavioral: Negative for confusion.    Blood pressure 102/68, pulse 60, temperature 97.6 F (36.4 C), temperature source Temporal, resp. rate 14, height 5\' 9"  (1.753 m), weight 160 lb (72.576 kg).  Physical Exam Physical Exam  Vitals reviewed. Constitutional: She appears well-developed and well-nourished.  Cardiovascular: Normal rate, regular rhythm and normal heart sounds.   Pulmonary/Chest: Effort normal and breath sounds normal. She has no wheezes. She has no rales.  Musculoskeletal:       Back:    Data Reviewed Notes from Dr Tawanna Cooler  Assessment    Back mass     Plan    Excision of back mass  This area symptomatic and she would like it excised. I agree with that. We discussed risks including bleeding, infection, and slight risk of recurrence. We discussed her time at work. We'll get this done as soon as possible.       Laqueena Hinchey 03/09/2013, 3:41 PM

## 2013-03-22 NOTE — Interval H&P Note (Signed)
History and Physical Interval Note:  03/22/2013 7:22 AM  Kristie Fitzgerald  has presented today for surgery, with the diagnosis of back mass  The various methods of treatment have been discussed with the patient and family. After consideration of risks, benefits and other options for treatment, the patient has consented to  Procedure(s): EXCISION MASS (N/A) as a surgical intervention .  The patient's history has been reviewed, patient examined, no change in status, stable for surgery.  I have reviewed the patient's chart and labs.  Questions were answered to the patient's satisfaction.     Petro Talent

## 2013-03-22 NOTE — Anesthesia Postprocedure Evaluation (Signed)
  Anesthesia Post-op Note  Patient: Kristie Fitzgerald  Procedure(s) Performed: Procedure(s) (LRB): EXCISION MASS (N/A)  Patient Location: PACU  Anesthesia Type: General  Level of Consciousness: awake and alert   Airway and Oxygen Therapy: Patient Spontanous Breathing  Post-op Pain: mild  Post-op Assessment: Post-op Vital signs reviewed, Patient's Cardiovascular Status Stable, Respiratory Function Stable, Patent Airway and No signs of Nausea or vomiting  Last Vitals:  Filed Vitals:   03/22/13 0900  BP: 101/56  Pulse: 78  Temp: 36.9 C  Resp: 16    Post-op Vital Signs: stable   Complications: No apparent anesthesia complications

## 2013-03-22 NOTE — Transfer of Care (Signed)
Immediate Anesthesia Transfer of Care Note  Patient: Kristie Fitzgerald  Procedure(s) Performed: Procedure(s): EXCISION MASS (N/A)  Patient Location: PACU  Anesthesia Type:General  Level of Consciousness: awake, sedated and patient cooperative  Airway & Oxygen Therapy: Patient Spontanous Breathing and Patient connected to face mask oxygen  Post-op Assessment: Report given to PACU RN and Post -op Vital signs reviewed and stable  Post vital signs: Reviewed and stable  Complications: No apparent anesthesia complications

## 2013-03-22 NOTE — Addendum Note (Signed)
Addendum created 03/22/13 4010 by Elesa Massed, CRNA   Modules edited: Anesthesia LDA

## 2013-03-23 ENCOUNTER — Encounter (HOSPITAL_COMMUNITY): Payer: Self-pay | Admitting: General Surgery

## 2013-03-24 ENCOUNTER — Telehealth (INDEPENDENT_AMBULATORY_CARE_PROVIDER_SITE_OTHER): Payer: Self-pay

## 2013-03-24 ENCOUNTER — Ambulatory Visit (INDEPENDENT_AMBULATORY_CARE_PROVIDER_SITE_OTHER): Payer: 59 | Admitting: Licensed Clinical Social Worker

## 2013-03-24 DIAGNOSIS — F4322 Adjustment disorder with anxiety: Secondary | ICD-10-CM

## 2013-03-24 NOTE — Telephone Encounter (Signed)
Called pt to notify her of the path report to be a benign lipoma per Dr Dwain Sarna.

## 2013-03-31 ENCOUNTER — Ambulatory Visit (INDEPENDENT_AMBULATORY_CARE_PROVIDER_SITE_OTHER): Payer: 59 | Admitting: Licensed Clinical Social Worker

## 2013-03-31 DIAGNOSIS — F4322 Adjustment disorder with anxiety: Secondary | ICD-10-CM

## 2013-04-06 ENCOUNTER — Encounter (INDEPENDENT_AMBULATORY_CARE_PROVIDER_SITE_OTHER): Payer: Commercial Managed Care - PPO | Admitting: General Surgery

## 2013-04-14 ENCOUNTER — Ambulatory Visit (INDEPENDENT_AMBULATORY_CARE_PROVIDER_SITE_OTHER): Payer: 59 | Admitting: Licensed Clinical Social Worker

## 2013-04-14 DIAGNOSIS — F4322 Adjustment disorder with anxiety: Secondary | ICD-10-CM

## 2013-04-23 ENCOUNTER — Encounter (INDEPENDENT_AMBULATORY_CARE_PROVIDER_SITE_OTHER): Payer: Self-pay | Admitting: General Surgery

## 2013-04-23 ENCOUNTER — Ambulatory Visit (INDEPENDENT_AMBULATORY_CARE_PROVIDER_SITE_OTHER): Payer: Commercial Managed Care - PPO | Admitting: General Surgery

## 2013-04-23 VITALS — BP 104/70 | HR 70 | Resp 18 | Ht 69.0 in | Wt 160.0 lb

## 2013-04-23 DIAGNOSIS — Z09 Encounter for follow-up examination after completed treatment for conditions other than malignant neoplasm: Secondary | ICD-10-CM

## 2013-04-26 NOTE — Progress Notes (Signed)
Subjective:     Patient ID: Kristie Fitzgerald, female   DOB: 12-02-1990, 22 y.o.   MRN: [id number]  HPI 32 yof with mass in left lower back that was symptomatic.  I excised this in or and path shows a lipoma. She has done well.  She has some right buttock/hip pain that is mild and occurred after she went back to work and was up working all day.    Review of Systems     Objective:   Physical Exam Incision healing well without infection    Assessment:     S/p lipoma excision     Plan:     She has done well.  I think she is sore from surgery and activity. This should pass in next several weeks.  Recommended some heat and nsaids if bothering her.  Also massage incision.  I told her that if still bothering her in a few weeks to call me.  Otherwise will see back as needed

## 2013-09-21 ENCOUNTER — Ambulatory Visit (INDEPENDENT_AMBULATORY_CARE_PROVIDER_SITE_OTHER): Payer: 59 | Admitting: Family Medicine

## 2013-09-21 ENCOUNTER — Encounter: Payer: Self-pay | Admitting: Family Medicine

## 2013-09-21 VITALS — Ht 69.0 in | Wt 174.4 lb

## 2013-09-21 DIAGNOSIS — E663 Overweight: Secondary | ICD-10-CM

## 2013-09-21 DIAGNOSIS — Z6825 Body mass index (BMI) 25.0-25.9, adult: Secondary | ICD-10-CM

## 2013-09-21 NOTE — Patient Instructions (Signed)
-   Eat at least 3 REAL meals and 1-2 snacks per day.  Aim for no more than 5 hours between eating.   - Real meal:  Would you serve this to a guest, and call it a meal? - Appetite management:  - Protein with every meal.    - Satisfaction with each meal.    - At least 3 meal components.  - Whole, real food vs. highly processed.    - Smaller plates, bowls, cups, etc.  - Advance planning is key to success:  - Shopping and preparing.   - Preparing leftovers.   - Keeping on hand certain staples, i.e., unsalted nuts, yogurt, powdered milk, frozen veg's, protein bars.   - Obtain twice as many veg's as protein or carbohydrate foods for both lunch and dinner. - Consider setting a GOAL related to # of processed snack foods you have per day/week.  Track it.   - Eating for non-hunger reasons:  - Delay (no urge lasts forever)  - Distract   - Distance  - Ask yourself:  Am I hungry?  If no, what do I really want now?  H ungry  A nxious  A ngry  L onely   T ired .Marland Kitchen... Bored, Depressed  - TASTE PREFERENCES ARE LEARNED.  This means that it will get easier to choose foods you know are good for you if you are exposed to them enough.    - Activity:  Try to move more frequently while you study, i.e., 1 min per 20 min sitting or 5 min per hour.   - Work on some shoulder/neck/posture exercises consistently.

## 2013-09-21 NOTE — Progress Notes (Signed)
Medical Nutrition Therapy:  Appt start time: 1400 end time:  1500.  Assessment:  Primary concerns today: Weight management.  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  She wants to lose weight.  She is aware that she doesn't get enough exercise, and she does not know balance and portion control.  She often craves salty snack foods and carb's, esp BBQ chips and Goldfish.  She has a [employer], so has been recording her intake recently.   Usual eating pattern includes 2-3 meals and 2 snacks per day. Usual physical activity includes walk/run to class, usually ~1 mile a day, and 30-min cardio videos she can do in her apt, usually not more than once a wk.  Frequent foods include 1-2 coffee w/ flavored creamer daily, flavored water 3-4 X wk, bkfst egg sandw or oatmeal, a lot of frozn meals.  Avoided foods include none.    24-hr recall: (Up at 12:30 PM; 1st good sleep in awhile) B ( AM)-   --- Snk ( AM)-   --- L (1 PM)-  Salad w/ l-f dressing & guacamole, fajita w/ chx, l-f chs, & peppers  Snk ( PM)-  none D (6:30 PM)-  4 oz beef filet, 1 c Brussels sprouts, coffee w/ creamer Snk ( PM)-  Salad w/ l-f ranch, chx, l-f chs, water Was recently in [place], where she said she "ate like crap."  Progress Towards Goal(s):  In progress.   Nutritional Diagnosis:  NI-5.8.3 Inappropriate intake of types of carbohydrates (specify):   As related to snacks.  As evidenced by usual daily intake of chips and other salty snack foods.    Intervention:  Nutrition education.  Monitoring/Evaluation:  Dietary intake, exercise, and body weight prn.

## 2013-09-22 ENCOUNTER — Telehealth: Payer: Self-pay | Admitting: Family Medicine

## 2013-09-22 DIAGNOSIS — F909 Attention-deficit hyperactivity disorder, unspecified type: Secondary | ICD-10-CM

## 2013-09-22 MED ORDER — LISDEXAMFETAMINE DIMESYLATE 20 MG PO CAPS: 20.0000 mg | ORAL_CAPSULE | ORAL | Status: DC

## 2013-09-22 MED ORDER — METHYLPHENIDATE HCL 10 MG PO TABS: 10.0000 mg | ORAL_TABLET | Freq: Every day | ORAL | Status: DC

## 2013-09-22 MED ORDER — METHYLPHENIDATE HCL 10 MG PO TABS: 10.0000 mg | ORAL_TABLET | Freq: Every day | ORAL | Status: DC | PRN

## 2013-09-22 NOTE — Telephone Encounter (Signed)
Okay x 3 months. 

## 2013-09-22 NOTE — Telephone Encounter (Signed)
Rx ready for pick up and patient is aware 

## 2013-09-22 NOTE — Telephone Encounter (Signed)
Patient called request a refill on methylphenidate (RITALIN) 10 MG tablet And lisdexamfetamine (VYVANSE) 20 MG capsule Please advise

## 2013-10-01 ENCOUNTER — Telehealth: Payer: Self-pay | Admitting: Family Medicine

## 2013-10-01 DIAGNOSIS — N938 Other specified abnormal uterine and vaginal bleeding: Secondary | ICD-10-CM

## 2013-10-01 DIAGNOSIS — N949 Unspecified condition associated with female genital organs and menstrual cycle: Secondary | ICD-10-CM

## 2013-10-01 NOTE — Telephone Encounter (Signed)
Pts best friend has an abnormal pap and she would like Dr. Sherren Mocha to refer her friend to a gynecologist that he thinks would be a good fit. Please advise. Thanks.

## 2013-10-04 MED ORDER — NORGESTREL-ETHINYL ESTRADIOL 0.3-30 MG-MCG PO TABS: 1.0000 | ORAL_TABLET | Freq: Every day | ORAL | Status: DC

## 2013-10-04 NOTE — Telephone Encounter (Signed)
Pt called back and stated her friend found a gyn, please disregard previous request.  However, she is out of town and needs refill of her norgestrel-ethinyl estradiol (LO/OVRAL,CRYSELLE) 0.3-30 MG-MCG tablet sent to [facility], [place] [phone]

## 2013-12-02 ENCOUNTER — Encounter: Payer: Self-pay | Admitting: Family Medicine

## 2013-12-02 ENCOUNTER — Ambulatory Visit (INDEPENDENT_AMBULATORY_CARE_PROVIDER_SITE_OTHER): Payer: 59 | Admitting: Family Medicine

## 2013-12-02 VITALS — BP 130/80 | HR 83 | Resp 20 | Ht 69.0 in | Wt 180.0 lb

## 2013-12-02 DIAGNOSIS — F32A Depression, unspecified: Secondary | ICD-10-CM

## 2013-12-02 DIAGNOSIS — F329 Major depressive disorder, single episode, unspecified: Secondary | ICD-10-CM

## 2013-12-02 DIAGNOSIS — J309 Allergic rhinitis, unspecified: Secondary | ICD-10-CM

## 2013-12-02 DIAGNOSIS — F909 Attention-deficit hyperactivity disorder, unspecified type: Secondary | ICD-10-CM

## 2013-12-02 DIAGNOSIS — F410 Panic disorder [episodic paroxysmal anxiety] without agoraphobia: Secondary | ICD-10-CM

## 2013-12-02 DIAGNOSIS — F3289 Other specified depressive episodes: Secondary | ICD-10-CM

## 2013-12-02 MED ORDER — METHYLPHENIDATE HCL 10 MG PO TABS: ORAL_TABLET | ORAL | Status: DC

## 2013-12-02 MED ORDER — FLUTICASONE PROPIONATE 50 MCG/ACT NA SUSP: 2.0000 | Freq: Every day | NASAL | Status: DC

## 2013-12-02 MED ORDER — LORAZEPAM 0.5 MG PO TABS: 0.5000 mg | ORAL_TABLET | Freq: Two times a day (BID) | ORAL | Status: DC | PRN

## 2013-12-02 NOTE — Progress Notes (Signed)
   Subjective:    Patient ID: Kristie Fitzgerald, female    DOB: [birth year], 23 y.o.   MRN: [id number]  HPI Kristie Fitzgerald is a 23 year old single female nonsmoker who is in [employer] school at [employer] who comes in today to discuss her medications  She takes Celexa 20 mg at bedtime and that helps with stress and her panic attacks. The Diovan Cialis causing her to have tremors. She would like to take just plain Ritalin 10 mg  She takes her BCPs LMP March 3 in good March 8 normal.  She's having difficulty sleeping. We tried trazodone but she had side effects. She has difficulty going to sleep when she goes to sleep she stays asleep. She also has allergic rhinitis.  She's drinking about 20+ ounces of caffeine daily   Review of Systems Review of systems otherwise negative except she's gained weight she's 180 pounds    Objective:   Physical Exam Well-developed well-nourished female no acute distress vital signs stable she is afebrile       Assessment & Plan:  Panic attacks continue Celexa  Sleep dysfunction Ativan 0.5 when necessary  Allergic rhinitis Claritin and steroid nasal spray  Adult ADD....Marland KitchenMarland Kitchen

## 2013-12-02 NOTE — Progress Notes (Signed)
Pre-visit discussion using our clinic review tool. No additional management support is needed unless otherwise documented below in the visit note.  

## 2013-12-02 NOTE — Patient Instructions (Signed)
Decrease your caffeine consumption no more than 4 ounces in the morning  Do not exercise after 5 PM  Ritalin 10 mg........ one twice daily  Ativan 0.5 mg.......Marland Kitchen 1 at bedtime when necessary for sleep  Claritin 10 mg plain daily and steroid nasal spray when necessary

## 2014-01-24 ENCOUNTER — Telehealth: Payer: Self-pay | Admitting: Family Medicine

## 2014-01-24 DIAGNOSIS — N390 Urinary tract infection, site not specified: Secondary | ICD-10-CM

## 2014-01-24 MED ORDER — NITROFURANTOIN MONOHYD MACRO 100 MG PO CAPS: ORAL_CAPSULE | ORAL | Status: DC

## 2014-01-24 NOTE — Telephone Encounter (Signed)
Pt req rx on nitrofurantoin, macrocrystal-monohydrate, (MACROBID) 100 MG capsule

## 2014-01-24 NOTE — Telephone Encounter (Signed)
Patient was told to call and ask for medication after intercourse to prevent a UTI.   Okay per Dr Sherren Mocha.  Rx sent.

## 2014-02-17 ENCOUNTER — Other Ambulatory Visit: Payer: Self-pay | Admitting: *Deleted

## 2014-02-17 DIAGNOSIS — F410 Panic disorder [episodic paroxysmal anxiety] without agoraphobia: Secondary | ICD-10-CM

## 2014-02-17 MED ORDER — LORAZEPAM 0.5 MG PO TABS: 0.5000 mg | ORAL_TABLET | Freq: Two times a day (BID) | ORAL | Status: DC | PRN

## 2014-02-24 ENCOUNTER — Telehealth: Payer: Self-pay | Admitting: Family Medicine

## 2014-02-24 DIAGNOSIS — F32A Depression, unspecified: Secondary | ICD-10-CM

## 2014-02-24 DIAGNOSIS — F329 Major depressive disorder, single episode, unspecified: Secondary | ICD-10-CM

## 2014-02-24 NOTE — Telephone Encounter (Signed)
[id number] - [place], [place] - [address] is requesting 90 day re-fill on citalopram (CELEXA) 20 MG tablet

## 2014-02-25 MED ORDER — CITALOPRAM HYDROBROMIDE 20 MG PO TABS: 20.0000 mg | ORAL_TABLET | Freq: Every day | ORAL | Status: DC

## 2014-02-25 NOTE — Telephone Encounter (Signed)
New Rx sent.

## 2014-04-04 ENCOUNTER — Encounter: Payer: 59 | Admitting: Family Medicine

## 2014-05-09 ENCOUNTER — Encounter: Payer: Self-pay | Admitting: Family Medicine

## 2014-05-09 ENCOUNTER — Ambulatory Visit (INDEPENDENT_AMBULATORY_CARE_PROVIDER_SITE_OTHER): Payer: 59 | Admitting: Family Medicine

## 2014-05-09 ENCOUNTER — Other Ambulatory Visit (HOSPITAL_COMMUNITY)
Admission: RE | Admit: 2014-05-09 | Discharge: 2014-05-09 | Disposition: A | Discharge status: Home / Self Care | Payer: 59 | Source: Ambulatory Visit | Attending: Family Medicine | Admitting: Family Medicine

## 2014-05-09 VITALS — BP 110/80 | Temp 98.6°F | Ht 68.5 in | Wt 185.0 lb

## 2014-05-09 DIAGNOSIS — N949 Unspecified condition associated with female genital organs and menstrual cycle: Secondary | ICD-10-CM

## 2014-05-09 DIAGNOSIS — F909 Attention-deficit hyperactivity disorder, unspecified type: Secondary | ICD-10-CM

## 2014-05-09 DIAGNOSIS — F3289 Other specified depressive episodes: Secondary | ICD-10-CM

## 2014-05-09 DIAGNOSIS — N925 Other specified irregular menstruation: Secondary | ICD-10-CM

## 2014-05-09 DIAGNOSIS — Z01419 Encounter for gynecological examination (general) (routine) without abnormal findings: Secondary | ICD-10-CM

## 2014-05-09 DIAGNOSIS — F329 Major depressive disorder, single episode, unspecified: Secondary | ICD-10-CM

## 2014-05-09 DIAGNOSIS — J302 Other seasonal allergic rhinitis: Secondary | ICD-10-CM

## 2014-05-09 DIAGNOSIS — Z1151 Encounter for screening for human papillomavirus (HPV): Secondary | ICD-10-CM | POA: Diagnosis present

## 2014-05-09 DIAGNOSIS — J3089 Other allergic rhinitis: Secondary | ICD-10-CM

## 2014-05-09 DIAGNOSIS — F32A Depression, unspecified: Secondary | ICD-10-CM

## 2014-05-09 DIAGNOSIS — N938 Other specified abnormal uterine and vaginal bleeding: Secondary | ICD-10-CM

## 2014-05-09 DIAGNOSIS — F410 Panic disorder [episodic paroxysmal anxiety] without agoraphobia: Secondary | ICD-10-CM

## 2014-05-09 MED ORDER — CITALOPRAM HYDROBROMIDE 20 MG PO TABS: 20.0000 mg | ORAL_TABLET | Freq: Every day | ORAL | Status: AC

## 2014-05-09 MED ORDER — LORAZEPAM 0.5 MG PO TABS: 0.5000 mg | ORAL_TABLET | Freq: Two times a day (BID) | ORAL | Status: DC | PRN

## 2014-05-09 MED ORDER — METHYLPHENIDATE HCL 10 MG PO TABS: ORAL_TABLET | ORAL | Status: DC

## 2014-05-09 MED ORDER — FLUTICASONE PROPIONATE 50 MCG/ACT NA SUSP: 2.0000 | Freq: Every day | NASAL | Status: DC

## 2014-05-09 MED ORDER — NORGESTREL-ETHINYL ESTRADIOL 0.3-30 MG-MCG PO TABS: 1.0000 | ORAL_TABLET | Freq: Every day | ORAL | Status: DC

## 2014-05-09 NOTE — Progress Notes (Signed)
Pre visit review using our clinic review tool, if applicable. No additional management support is needed unless otherwise documented below in the visit note. 

## 2014-05-09 NOTE — Patient Instructions (Signed)
Continue your current medications  Remember to wear those sunscreens  And gargle after each meal  Adjusts the computer to decrease your neck pain

## 2014-05-09 NOTE — Progress Notes (Signed)
   Subjective:    Patient ID: Kristie Fitzgerald, female    DOB: 1991-04-24, 23 y.o.   MRN: [id number]  HPI Kristie Fitzgerald is a 23 year old single female who comes in today for general checkup  She takes Celexa 20 mg daily because of a history of mild depression. She does not seem to be doing well. Her weight is up 285 pounds and she's having some issues with mood swings. I encouraged her and she's agreed to see the psychiatrist a Dr. Arvil Persons office for further evaluation  She takes Ativan 0.5 each bedtime for sleep, Ritalin 10 mg twice a day for adult ADD, Macrodantin 100 mg when necessary for recurrent urinary tract infections and BCPs. LMP a three-day 33 normal. She's having trouble with her neck soreness for the last year. She also has cryptic tonsils.  She's going to [employer] for anatomy and physiology in hopes of going to PA school   Review of Systems  Constitutional: Negative.   HENT: Negative.   Eyes: Negative.   Respiratory: Negative.   Cardiovascular: Negative.   Gastrointestinal: Negative.   Genitourinary: Negative.   Musculoskeletal: Negative.   Neurological: Negative.   Psychiatric/Behavioral: Negative.        Objective:   Physical Exam  Constitutional: She appears well-developed and well-nourished.  HENT:  Head: Normocephalic and atraumatic.  Right Ear: External ear normal.  Left Ear: External ear normal.  Nose: Nose normal.  Mouth/Throat: Oropharynx is clear and moist.  Eyes: EOM are normal. Pupils are equal, round, and reactive to light.  Neck: Normal range of motion. Neck supple. No thyromegaly present.  Cardiovascular: Normal rate, regular rhythm, normal heart sounds and intact distal pulses.  Exam reveals no gallop and no friction rub.   No murmur heard. Pulmonary/Chest: Effort normal and breath sounds normal.  Abdominal: Soft. Bowel sounds are normal. She exhibits no distension and no mass. There is no tenderness. There is no rebound.  Genitourinary: Vagina normal  and uterus normal. Guaiac negative stool. No vaginal discharge found.  Bilateral breast exam normal  Musculoskeletal: Normal range of motion.  Lymphadenopathy:    She has no cervical adenopathy.  Neurological: She is alert. She has normal reflexes. No cranial nerve deficit. She exhibits normal muscle tone. Coordination normal.  Skin: Skin is warm and dry.  Total body skin exam normal except for some slight sunburn.,,,,,,,, her father had melanoma,,,,,,,,,,, she forgot to wear sunscreens  Psychiatric: She has a normal mood and affect. Her behavior is normal. Judgment and thought content normal.          Assessment & Plan:  Healthy female  History of depression continue Celexa 20 mg and Ativan 0.5 each bedtime,,,,,,,,,, psych consult pending  Adult ADD continue Ritalin 10 mg twice a day  DU B.,,,,,,,,,,, continue BCPs  History of recurrent urinary tract infections,,,,,,,,,,,,,,,,,, Tacker bed when necessary,

## 2014-05-11 LAB — CYTOLOGY - PAP

## 2014-08-01 ENCOUNTER — Encounter: Payer: Self-pay | Admitting: Family Medicine

## 2014-08-01 ENCOUNTER — Ambulatory Visit (INDEPENDENT_AMBULATORY_CARE_PROVIDER_SITE_OTHER): Payer: 59 | Admitting: Family Medicine

## 2014-08-01 VITALS — BP 120/84 | Temp 98.7°F | Ht 69.0 in | Wt 186.0 lb

## 2014-08-01 DIAGNOSIS — J069 Acute upper respiratory infection, unspecified: Secondary | ICD-10-CM

## 2014-08-01 MED ORDER — HYDROCODONE-HOMATROPINE 5-1.5 MG/5ML PO SYRP: 5.0000 mL | ORAL_SOLUTION | Freq: Three times a day (TID) | ORAL | Status: DC | PRN

## 2014-08-01 NOTE — Progress Notes (Signed)
   Subjective:    Patient ID: Kristie Fitzgerald, female    DOB: [birth year], 23 y.o.   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

## 2014-08-01 NOTE — Patient Instructions (Signed)
Tylenol or aspirin for your general viral symptoms and muscle aches  Drink lots of liquids  Hydromet,,,,,,,, 1/2-1 teaspoon 3 times daily when necessary for cough  Return when necessary

## 2014-08-01 NOTE — Progress Notes (Signed)
Pre visit review using our clinic review tool, if applicable. No additional management support is needed unless otherwise documented below in the visit note. 

## 2015-04-14 ENCOUNTER — Ambulatory Visit (INDEPENDENT_AMBULATORY_CARE_PROVIDER_SITE_OTHER): Payer: 59 | Admitting: Adult Health

## 2015-04-14 ENCOUNTER — Encounter: Payer: Self-pay | Admitting: Adult Health

## 2015-04-14 VITALS — BP 110/80 | Temp 98.5°F | Ht 69.0 in | Wt 186.5 lb

## 2015-04-14 DIAGNOSIS — M25512 Pain in left shoulder: Secondary | ICD-10-CM

## 2015-04-14 DIAGNOSIS — M25511 Pain in right shoulder: Secondary | ICD-10-CM | POA: Diagnosis not present

## 2015-04-14 MED ORDER — METHOCARBAMOL 750 MG PO TABS: 750.0000 mg | ORAL_TABLET | Freq: Four times a day (QID) | ORAL | Status: DC | PRN

## 2015-04-14 NOTE — Patient Instructions (Addendum)
  It was great meeting you!   Please use the Robaxin every 6 hours as needed. Also use 600mg  Ibuprofen every 6-8 hours. Add ice and heat.   Someone from sports medicine will call yo to set up an appointment.   Please let me know if this therapy is not working.     Shoulder Pain The shoulder is the joint that connects your arms to your body. The bones that form the shoulder joint include the upper arm bone (humerus), the shoulder blade (scapula), and the collarbone (clavicle). The top of the humerus is shaped like a ball and fits into a rather flat socket on the scapula (glenoid cavity). A combination of muscles and strong, fibrous tissues that connect muscles to bones (tendons) support your shoulder joint and hold the ball in the socket. Small, fluid-filled sacs (bursae) are located in different areas of the joint. They act as cushions between the bones and the overlying soft tissues and help reduce friction between the gliding tendons and the bone as you move your arm. Your shoulder joint allows a wide range of motion in your arm. This range of motion allows you to do things like scratch your back or throw a ball. However, this range of motion also makes your shoulder more prone to pain from overuse and injury. Causes of shoulder pain can originate from both injury and overuse and usually can be grouped in the following four categories:  Redness, swelling, and pain (inflammation) of the tendon (tendinitis) or the bursae (bursitis).  Instability, such as a dislocation of the joint.  Inflammation of the joint (arthritis).  Broken bone (fracture). HOME CARE INSTRUCTIONS   Apply ice to the sore area.  Put ice in a plastic bag.  Place a towel between your skin and the bag.  Leave the ice on for 15-20 minutes, 3-4 times per day for the first 2 days, or as directed by your health care provider.  Stop using cold packs if they do not help with the pain.  If you have a shoulder sling or  immobilizer, wear it as long as your caregiver instructs. Only remove it to shower or bathe. Move your arm as little as possible, but keep your hand moving to prevent swelling.  Squeeze a soft ball or foam pad as much as possible to help prevent swelling.  Only take over-the-counter or prescription medicines for pain, discomfort, or fever as directed by your caregiver. SEEK MEDICAL CARE IF:   Your shoulder pain increases, or new pain develops in your arm, hand, or fingers.  Your hand or fingers become cold and numb.  Your pain is not relieved with medicines. SEEK IMMEDIATE MEDICAL CARE IF:   Your arm, hand, or fingers are numb or tingling.  Your arm, hand, or fingers are significantly swollen or turn white or blue. MAKE SURE YOU:   Understand these instructions.  Will watch your condition.  Will get help right away if you are not doing well or get worse. Document Released: 06/19/2005 Document Revised: 01/24/2014 Document Reviewed: 08/24/2011 [facility] Patient Information 2015 [place], [place]. This information is not intended to replace advice given to you by your health care provider. Make sure you discuss any questions you have with your health care provider.

## 2015-04-14 NOTE — Progress Notes (Signed)
  Kristie Fitzgerald is a 24 y.o. female  Shoulder Pain: Patient complaints of bilateral shoulder pain R>L. This is evaluated as a personal injury. The pain is described as dull throbbing.  The onset of the pain was gradual, starting about 2 months ago.  The pain occurs continuously  But is worsen when she is on her left side during bedtime. Location is mostly " deep in the shoulder and between shoulder blades.Played Volleyball and did swimming in high school, had history of same in the past. No history  of dislocation. Symptoms are aggravated by resting on left side. Sympto ms are diminisohed by Aleve  She reports that when she wakes up in the morning and feels " like it is asleep"  Has been taking Advil for pain,which she says helps but that she is having to take 3-4 of them at a time and it does not work as well as it used too.    Past Medical History  Diagnosis Date  . Precancerous skin lesion     REMOVED  . Frequent UTI   . Anxiety   . Mass     ON BACK   Past Surgical History  Procedure Laterality Date  . Wisdon teeth      REMOVED  . Mass excision N/A 03/22/2013    Procedure: EXCISION MASS;  Surgeon: Rolm Bookbinder, MD;  Location: [facility];  Service: General;  Laterality: N/A;    Current outpatient prescriptions:  .  citalopram (CELEXA) 20 MG tablet, Take 1 tablet (20 mg total) by mouth at bedtime., Disp: 100 tablet, Rfl: 3 .  amphetamine-dextroamphetamine (ADDERALL) 10 MG tablet, Take 2 tablets by mouth daily., Disp: , Rfl: 0 .  cloNIDine (CATAPRES) 0.1 MG tablet, TAKE ONE OR TWO TABLETS BY MOUTH NIGHTLY AT BEDTIME **MAX RETAIL FILLS EXCEEDED**, Disp: , Rfl: 3 .  norgestrel-ethinyl estradiol (LO/OVRAL,CRYSELLE) 0.3-30 MG-MCG tablet, Take 1 tablet by mouth daily., Disp: 3 Package, Rfl: 3 No Known Allergies  reports that she has never smoked. She does not have any smokeless tobacco history on file. She reports that she drinks alcohol. She reports that she does not use illicit  drugs. Family History  Problem Relation Age of Onset  . Cancer Father     meloanoma  . Depression Other     Shoulder: Inspection reveals no abnormalities, atrophy or asymmetry. Palpation is normal with no tenderness over AC joint or bicipital groove. ROM is full in all planes. Rotator cuff strength normal throughout. No signs of impingement with negative Neer and Hawkin's tests, empty can. No labral pathology noted with negative Obrien's and good stability. Normal scapular function observed. No painful arc and no drop arm sign. No apprehension sign.   1. Bilateral shoulder pain [M25.511, M25.512] - methocarbamol (ROBAXIN-750) 750 MG tablet; Take 1 tablet (750 mg total) by mouth every 6 (six) hours as needed for muscle spasms.  Dispense: 30 tablet; Refill: 1 - Ambulatory referral to Sports Medicine - Ibuprofen 600mg  Q 8 H.  - Ice and heat applied to site.  - Use stretching exercises - Follow up if no improvement in the next 2-3 days.

## 2015-04-28 ENCOUNTER — Telehealth: Payer: Self-pay | Admitting: Family Medicine

## 2015-04-28 MED ORDER — SULFAMETHOXAZOLE-TRIMETHOPRIM 800-160 MG PO TABS: 1.0000 | ORAL_TABLET | Freq: Two times a day (BID) | ORAL | Status: DC

## 2015-04-28 NOTE — Telephone Encounter (Signed)
Message sent to Dr Sherren Mocha

## 2015-04-28 NOTE — Telephone Encounter (Signed)
Pt feels she has a UTI and would like to know if antibiotics can be called in for her w/o an apt or if she needs to come in for a UA.

## 2015-04-28 NOTE — Telephone Encounter (Signed)
[facility] bid #[phone] per Dr Sherren Mocha.  Patient is aware and Rx sent.

## 2015-05-02 ENCOUNTER — Encounter: Payer: Self-pay | Admitting: Family Medicine

## 2015-05-02 ENCOUNTER — Ambulatory Visit (INDEPENDENT_AMBULATORY_CARE_PROVIDER_SITE_OTHER): Payer: 59 | Admitting: Family Medicine

## 2015-05-02 ENCOUNTER — Other Ambulatory Visit (INDEPENDENT_AMBULATORY_CARE_PROVIDER_SITE_OTHER): Payer: 59

## 2015-05-02 VITALS — BP 104/72 | HR 84 | Ht 69.0 in | Wt 188.0 lb

## 2015-05-02 DIAGNOSIS — M25512 Pain in left shoulder: Secondary | ICD-10-CM | POA: Diagnosis not present

## 2015-05-02 DIAGNOSIS — M899 Disorder of bone, unspecified: Secondary | ICD-10-CM

## 2015-05-02 DIAGNOSIS — M9908 Segmental and somatic dysfunction of rib cage: Secondary | ICD-10-CM

## 2015-05-02 DIAGNOSIS — M9902 Segmental and somatic dysfunction of thoracic region: Secondary | ICD-10-CM

## 2015-05-02 DIAGNOSIS — M9901 Segmental and somatic dysfunction of cervical region: Secondary | ICD-10-CM

## 2015-05-02 DIAGNOSIS — M999 Biomechanical lesion, unspecified: Secondary | ICD-10-CM

## 2015-05-02 NOTE — Assessment & Plan Note (Signed)
Patient does have more of a dysfunction I'm thinking more muscle imbalances that are likely causing some of the discomfort. Patient does have significant amount of breast tissue that likely does cause some postural changes. Patient does have increasing kyphosis of the upper thoracic spine the can also contributing as well. Patient work with Product/process development scientist today to learn home exercises in greater detail. Patient did respond very well to osteopathic manipulation. We discussed over-the-counter natural supplementations and postural and ergonomic changes that could be beneficial. Patient will come back and see me again in 3-4 weeks for further evaluation and treatment.

## 2015-05-02 NOTE — Progress Notes (Signed)
Pre visit review using our clinic review tool, if applicable. No additional management support is needed unless otherwise documented below in the visit note. 

## 2015-05-02 NOTE — Patient Instructions (Signed)
Nice to meet you Start the scapular rehab exercises 3x/week Focus on your posture throughout the day - tennis ball between shoulder blades when sitting in a chair Try to stand with heels, hips, shoulders and back of head against the wall- build to doing for 73minutes a day Turmeric 500mg  2x/day Vitamin D 2000units/day See me again in 3-4 weeks  Standing:  Secure a rubber exercise band/tubing so that it is at the height of your shoulders when you are either standing or sitting on a firm arm-less chair.  Grasp an end of the band/tubing in each hand and have your palms face each other. Straighten your elbows and lift your hands straight in front of you at shoulder height. Step back away from the secured end of band/tubing until it becomes tense.  Squeeze your shoulder blades together. Keeping your elbows locked and your hands at shoulder-height, bring your hands out to your side.  Hold __________ seconds. Slowly ease the tension on the band/tubing as you reverse the directions and return to the starting position. Repeat __________ times. Complete this exercise __________ times per day. STRENGTH - Scapular Retractors  Secure a rubber exercise band/tubing so that it is at the height of your shoulders when you are either standing or sitting on a firm arm-less chair.  With a palm-down grip, grasp an end of the band/tubing in each hand. Straighten your elbows and lift your hands straight in front of you at shoulder height. Step back away from the secured end of band/tubing until it becomes tense.  Squeezing your shoulder blades together, draw your elbows back as you bend them. Keep your upper arm lifted away from your body throughout the exercise.  Hold __________ seconds. Slowly ease the tension on the band/tubing as you reverse the directions and return to the starting position. Repeat __________ times. Complete this exercise __________ times per day. STRENGTH - Shoulder Extensors   Secure a  rubber exercise band/tubing so that it is at the height of your shoulders when you are either standing or sitting on a firm arm-less chair.  With a thumbs-up grip, grasp an end of the band/tubing in each hand. Straighten your elbows and lift your hands straight in front of you at shoulder height. Step back away from the secured end of band/tubing until it becomes tense.  Squeezing your shoulder blades together, pull your hands down to the sides of your thighs. Do not allow your hands to go behind you.  Hold for __________ seconds. Slowly ease the tension on the band/tubing as you reverse the directions and return to the starting position. Repeat __________ times. Complete this exercise __________ times per day.  STRENGTH - Scapular Retractors and External Rotators  Secure a rubber exercise band/tubing so that it is at the height of your shoulders when you are either standing or sitting on a firm arm-less chair.  With a palm-down grip, grasp an end of the band/tubing in each hand. Bend your elbows 90 degrees and lift your elbows to shoulder height at your sides. Step back away from the secured end of band/tubing until it becomes tense.  Squeezing your shoulder blades together, rotate your shoulder so that your upper arm and elbow remain stationary, but your fists travel upward to head-height.  Hold __________ for seconds. Slowly ease the tension on the band/tubing as you reverse the directions and return to the starting position. Repeat __________ times. Complete this exercise __________ times per day.  STRENGTH - Scapular Retractors and External Rotators,  Rowing  Secure a rubber exercise band/tubing so that it is at the height of your shoulders when you are either standing or sitting on a firm arm-less chair.  With a palm-down grip, grasp an end of the band/tubing in each hand. Straighten your elbows and lift your hands straight in front of you at shoulder height. Step back away from the  secured end of band/tubing until it becomes tense.  Step 1: Squeeze your shoulder blades together. Bending your elbows, draw your hands to your chest as if you are rowing a boat. At the end of this motion, your hands and elbow should be at shoulder-height and your elbows should be out to your sides.  Step 2: Rotate your shoulder to raise your hands above your head. Your forearms should be vertical and your upper-arms should be horizontal.  Hold for __________ seconds. Slowly ease the tension on the band/tubing as you reverse the directions and return to the starting position. Repeat __________ times. Complete this exercise __________ times per day.  STRENGTH - Scapular Retractors and Elevators  Secure a rubber exercise band/tubing so that it is at the height of your shoulders when you are either standing or sitting on a firm arm-less chair.  With a thumbs-up grip, grasp an end of the band/tubing in each hand. Step back away from the secured end of band/tubing until it becomes tense.  Squeezing your shoulder blades together, straighten your elbows and lift your hands straight over your head.  Hold for __________ seconds. Slowly ease the tension on the band/tubing as you reverse the directions and return to the starting position. Repeat __________ times. Complete this exercise __________ times per day.  Document Released: 09/09/2005 Document Revised: 12/02/2011 Document Reviewed: 12/22/2008 [facility] Patient Information 2015 [place], [place]. This information is not intended to replace advice given to you by your health care provider. Make sure you discuss any questions you have with your health care provider.

## 2015-05-02 NOTE — Assessment & Plan Note (Signed)
Decision today to treat with OMT was based on Physical Exam  After verbal consent patient was treated with HVLA, ME, FPR techniques in cervical, thoracic and rib areas  Patient tolerated the procedure well with improvement in symptoms  Patient given exercises, stretches and lifestyle modifications  See medications in patient instructions if given  Patient will follow up in 3 weeks

## 2015-05-02 NOTE — Progress Notes (Signed)
[facility], [place] [address] Phone: [phone] Subjective:    I'm seeing this patient by the request  of:  Kristie ALLEN, MD   CC: Bilateral shoulder pain  Kristie Fitzgerald is a 24 y.o. female coming in with complaint of bilateral shoulder pain. Patient is had this pain for multiple weeks if not months. Patient has not number any true injury. Patient states that it is more as a dull throbbing aching pain. No significant radiation down the arm but sometimes can have numbness and tingling in her fingertips. 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 Patient states that it is uncomfortable at night but denies waking her up. Patient has tried some over-the-counter medications with minimal improvement. Patient does have muscle relaxer that was given to her by urgent care facility that has been helpful before. Patient still has some of this. Patient rates the severity of pain as 5 out of 10. Patient is able to do daily activities but finds it concerning cousin never seems to go completely away at this time.  Past Medical History  Diagnosis Date  . Precancerous skin lesion     REMOVED  . Frequent UTI   . Anxiety   . Mass     ON BACK   Past Surgical History  Procedure Laterality Date  . Wisdon teeth      REMOVED  . Mass excision N/A 03/22/2013    Procedure: EXCISION MASS;  Surgeon: Rolm Bookbinder, MD;  Location: [facility];  Service: General;  Laterality: N/A;   History  Substance Use Topics  . Smoking status: Never Smoker   . Smokeless tobacco: Not on file  . Alcohol Use: Yes     Comment: SOCIAL   Family History  Problem Relation Age of Onset  . Cancer Father     meloanoma  . Depression Other    No Known Allergies      Past medical history, social, surgical and family history all reviewed in electronic medical record.   Review of Systems: No headache, visual changes, nausea,  vomiting, diarrhea, constipation, dizziness, abdominal pain, skin rash, fevers, chills, night sweats, weight loss, swollen lymph nodes, body aches, joint swelling, muscle aches, chest pain, shortness of breath, mood changes.   Objective Blood pressure 104/72, pulse 84, height 5\' 9"  (1.753 m), weight 188 lb (85.276 kg), last menstrual period 03/29/2015, SpO2 95 %.  General: No apparent distress alert and oriented x3 mood and affect normal, dressed appropriately.  HEENT: Pupils equal, extraocular movements intact  Respiratory: Patient's speak in full sentences and does not appear short of breath  Cardiovascular: No lower extremity edema, non tender, no erythema  Skin: Warm dry intact with no signs of infection or rash on extremities or on axial skeleton.  Abdomen: Soft nontender  Neuro: Cranial nerves II through XII are intact, neurovascularly intact in all extremities with 2+ DTRs and 2+ pulses.  Lymph: No lymphadenopathy of posterior or anterior cervical chain or axillae bilaterally.  Gait normal with good balance and coordination.  MSK:  Non tender with full range of motion and good stability and symmetric strength and tone of  elbows, wrist, hip, knee and ankles bilaterally.  Neck: Inspection unremarkable. Increasing kyphosis of the upper thoracic No palpable stepoffs. Negative Spurling's maneuver. Full neck range of motion Grip strength and sensation normal in bilateral hands Strength good C4 to T1 distribution No sensory change to C4 to T1  Negative Hoffman sign bilaterally Reflexes normal Shoulder: Bilateral Inspection reveals no abnormalities, atrophy or asymmetry. Palpation is normal with no tenderness over AC joint or bicipital groove. ROM is full in all planes. Rotator cuff strength normal throughout. No signs of impingement with negative Neer and Hawkin's tests, empty can sign. Speeds and Yergason's tests normal. No labral pathology noted with negative Obrien's, negative clunk  and good stability. Normal scapular function observed. No painful arc and no drop arm sign. No apprehension sign   Osteopathic findings C3 flexed rotated and side bent left C7 flexed rotated and side bent right T1 flexed rotated and side bent right T3 extended rotated and side bent left with inhaled third rib  Procedure note 97110; 15 minutes spent for Therapeutic exercises as stated in above notes.  This included exercises focusing on stretching, strengthening, with significant focus on eccentric aspects. Shoulder Exercises that included:  Basic scapular stabilization to include adduction and depression of scapula Scaption, focusing on proper movement and good control Internal and External rotation utilizing a theraband, with elbow tucked at side entire time Rows with theraband  Proper technique shown and discussed handout in great detail with ATC.  All questions were discussed and answered.     Impression and Recommendations:     This case required medical decision making of moderate complexity.

## 2015-05-04 ENCOUNTER — Encounter: Payer: Self-pay | Admitting: Adult Health

## 2015-05-04 ENCOUNTER — Ambulatory Visit (INDEPENDENT_AMBULATORY_CARE_PROVIDER_SITE_OTHER): Payer: 59 | Admitting: Adult Health

## 2015-05-04 DIAGNOSIS — J358 Other chronic diseases of tonsils and adenoids: Secondary | ICD-10-CM | POA: Diagnosis not present

## 2015-05-04 DIAGNOSIS — J039 Acute tonsillitis, unspecified: Secondary | ICD-10-CM

## 2015-05-04 LAB — POCT RAPID STREP A (OFFICE): Rapid Strep A Screen: NEGATIVE

## 2015-05-04 MED ORDER — PREDNISONE 20 MG PO TABS: 20.0000 mg | ORAL_TABLET | Freq: Every day | ORAL | Status: DC

## 2015-05-04 NOTE — Progress Notes (Signed)
Subjective:    Patient ID: Kristie Fitzgerald, female    DOB: November 06, 1990, 24 y.o.   MRN: [id number]  HPI  24 year old female who presents to the office today for swollen tonsils. This was first noticed 3-4 days ago. She does not endorse much pain, but has been removing 5-6 tonsil stones per day. She has a chronic issue with tonsil stones. Denies difficulty swallowing, talking or breathing. No fevers, nausea, vomiting.   Review of Systems  Constitutional: Negative.   HENT: Positive for sore throat. Negative for postnasal drip, rhinorrhea, trouble swallowing and voice change.        Swollen tonsils  Skin: Negative.   Neurological: Negative.   Psychiatric/Behavioral: Negative.   All other systems reviewed and are negative.  Past Medical History  Diagnosis Date  . Precancerous skin lesion     REMOVED  . Frequent UTI   . Anxiety   . Mass     ON BACK    Social History   Social History  . Marital Status: Single    Spouse Name: N/A  . Number of Children: N/A  . Years of Education: N/A   Occupational History  . Not on file.   Social History Main Topics  . Smoking status: Never Smoker   . Smokeless tobacco: Not on file  . Alcohol Use: Yes     Comment: SOCIAL  . Drug Use: No  . Sexual Activity:    Partners: Male    Birth Control/ Protection: Condom, Pill   Other Topics Concern  . Not on file   Social History Narrative    Past Surgical History  Procedure Laterality Date  . Wisdon teeth      REMOVED  . Mass excision N/A 03/22/2013    Procedure: EXCISION MASS;  Surgeon: Rolm Bookbinder, MD;  Location: [facility];  Service: General;  Laterality: N/A;    Family History  Problem Relation Age of Onset  . Cancer Father     meloanoma  . Depression Other     No Known Allergies  Current Outpatient Prescriptions on File Prior to Visit  Medication Sig Dispense Refill  . amphetamine-dextroamphetamine (ADDERALL) 10 MG tablet Take 2 tablets by mouth daily.  0  .  citalopram (CELEXA) 20 MG tablet Take 1 tablet (20 mg total) by mouth at bedtime. 100 tablet 3  . cloNIDine (CATAPRES) 0.1 MG tablet TAKE ONE OR TWO TABLETS BY MOUTH NIGHTLY AT BEDTIME **MAX RETAIL FILLS EXCEEDED**  3  . methocarbamol (ROBAXIN-750) 750 MG tablet Take 1 tablet (750 mg total) by mouth every 6 (six) hours as needed for muscle spasms. 30 tablet 1  . norgestrel-ethinyl estradiol (LO/OVRAL,CRYSELLE) 0.3-30 MG-MCG tablet Take 1 tablet by mouth daily. 3 Package 3  . sulfamethoxazole-trimethoprim (BACTRIM DS,SEPTRA DS) 800-160 MG per tablet Take 1 tablet by mouth 2 (two) times daily. 14 tablet 0   No current facility-administered medications on file prior to visit.    BP 110/80 mmHg  Temp(Src) 98.1 F (36.7 C) (Oral)  Ht 5\' 9"  (1.753 m)  Wt 186 lb 3.2 oz (84.46 kg)  BMI 27.48 kg/m2  LMP 03/29/2015       Objective:   Physical Exam  Constitutional: She is oriented to person, place, and time. She appears well-developed and well-nourished. No distress.  HENT:  Right Ear: External ear normal.  Left Ear: External ear normal.  Nose: Nose normal.  Mouth/Throat: No oropharyngeal exudate.  Significantly enlarged tonsils. No exudate noted.   Neck: Normal  range of motion. Neck supple. No thyromegaly present.  Lymphadenopathy:    She has cervical adenopathy.  Neurological: She is alert and oriented to person, place, and time.  Skin: Skin is warm and dry. No rash noted. She is not diaphoretic. No erythema. No pallor.  Psychiatric: She has a normal mood and affect. Her behavior is normal. Judgment and thought content normal.  Nursing note and vitals reviewed.      Assessment & Plan:  1. Tonsil stone - Ambulatory referral to ENT  2. Acute tonsillitis - Likely Viral  - predniSONE (DELTASONE) 20 MG tablet; Take 1 tablet (20 mg total) by mouth daily with breakfast.  Dispense: 9 tablet; Refill: 0 - POC Rapid Strep A- Negative - Follow up if no improvement in 2-3 days.  - go to the  ER for any SOB, trouble swallowing or talking.

## 2015-05-04 NOTE — Progress Notes (Signed)
Pre visit review using our clinic review tool, if applicable. No additional management support is needed unless otherwise documented below in the visit note. 

## 2015-05-04 NOTE — Patient Instructions (Addendum)
It was great seeing you again!  Take the prednisone as directed: Day 1 40 mg Day 2 40 mg Day 3 40 mg Day 4 20 mg Day 5 20 mg Day 6 20 mg  If you have any difficulty breathing or swallowing, please go to the ER  Someone will call you to set up your ENT appointment.   Follow up if no improvement in the next 2-3 days.

## 2015-05-23 ENCOUNTER — Ambulatory Visit: Payer: 59 | Admitting: Family Medicine

## 2015-06-01 ENCOUNTER — Other Ambulatory Visit: Payer: Self-pay | Admitting: Family Medicine

## 2015-06-05 ENCOUNTER — Ambulatory Visit (INDEPENDENT_AMBULATORY_CARE_PROVIDER_SITE_OTHER): Payer: 59 | Admitting: Family Medicine

## 2015-06-05 ENCOUNTER — Telehealth: Payer: Self-pay | Admitting: Family Medicine

## 2015-06-05 ENCOUNTER — Encounter: Payer: Self-pay | Admitting: Family Medicine

## 2015-06-05 VITALS — BP 110/80 | Temp 98.8°F | Wt 186.0 lb

## 2015-06-05 DIAGNOSIS — N390 Urinary tract infection, site not specified: Secondary | ICD-10-CM

## 2015-06-05 DIAGNOSIS — R300 Dysuria: Secondary | ICD-10-CM

## 2015-06-05 LAB — POCT URINALYSIS DIPSTICK
Bilirubin, UA: NEGATIVE
Blood, UA: 1
Glucose, UA: NEGATIVE
KETONES UA: NEGATIVE
NITRITE UA: POSITIVE
PROTEIN UA: NEGATIVE
Spec Grav, UA: 1.015
Urobilinogen, UA: 0.2
pH, UA: 7.5

## 2015-06-05 MED ORDER — SULFAMETHOXAZOLE-TRIMETHOPRIM 800-160 MG PO TABS: ORAL_TABLET | ORAL | Status: DC

## 2015-06-05 NOTE — Patient Instructions (Signed)
Septra DS..........Marland Kitchen 1 tab twice daily 7 days then one half tab at bedtime until bottle empty  I will call you the report on urine culture  Would then discuss prophylaxis in the future.

## 2015-06-05 NOTE — Telephone Encounter (Signed)
Pt has a history of chronic uti and would like abx call into [facility] in  [address]

## 2015-06-05 NOTE — Progress Notes (Signed)
Influenza immunization was not given due to patient will get one at work.

## 2015-06-05 NOTE — Telephone Encounter (Signed)
Per Dr Sherren Mocha patient should be seen today.  Patient is aware.

## 2015-06-05 NOTE — Progress Notes (Signed)
   Subjective:    Patient ID: Kristie Fitzgerald, female    DOB: Jan 07, 1991, 24 y.o.   MRN: [id number]  HPI  Kristie Fitzgerald is a 24 year old single female nonsmoker who comes in today for evaluation of urinary tract symptoms  She's had recurrent urinary tract symptoms in the past 2 years ago we put her on Macrobid for year 2 in the quiet down. She's had 2 infections now on the last 6 weeks. This recent infection was mid-August she took 10 days of Septra symptoms resolved or 4 days after she started her medication and she completed the prescription.  Her symptoms started last night with a sensation of frequency. No fever chills or back pain. LMP [id number] normal she's on BCPs   Review of Systems    review of systems otherwise negative Objective:   Physical Exam  Developed well-nourished female no acute distress vital signs stable she's afebrile abdominal exam was negative urinalysis shows 1+ blood positive nitrate positive white cells culture pending      Assessment & Plan:  Urinary tract infection,,,,,,, urine culture,, begin Septra DS...... consider going back on the Macrobid because of the recurrence

## 2015-06-05 NOTE — Progress Notes (Signed)
Pre visit review using our clinic review tool, if applicable. No additional management support is needed unless otherwise documented below in the visit note. 

## 2015-06-07 ENCOUNTER — Ambulatory Visit (INDEPENDENT_AMBULATORY_CARE_PROVIDER_SITE_OTHER): Payer: 59 | Admitting: Family Medicine

## 2015-06-07 ENCOUNTER — Encounter: Payer: Self-pay | Admitting: Family Medicine

## 2015-06-07 VITALS — BP 112/80 | HR 108 | Ht 69.0 in | Wt 187.0 lb

## 2015-06-07 DIAGNOSIS — M899 Disorder of bone, unspecified: Secondary | ICD-10-CM

## 2015-06-07 DIAGNOSIS — M999 Biomechanical lesion, unspecified: Secondary | ICD-10-CM

## 2015-06-07 DIAGNOSIS — M9908 Segmental and somatic dysfunction of rib cage: Secondary | ICD-10-CM

## 2015-06-07 DIAGNOSIS — M9902 Segmental and somatic dysfunction of thoracic region: Secondary | ICD-10-CM | POA: Diagnosis not present

## 2015-06-07 DIAGNOSIS — M9901 Segmental and somatic dysfunction of cervical region: Secondary | ICD-10-CM | POA: Diagnosis not present

## 2015-06-07 LAB — URINE CULTURE: Colony Count: 80000

## 2015-06-07 NOTE — Assessment & Plan Note (Signed)
Decision today to treat with OMT was based on Physical Exam  After verbal consent patient was treated with HVLA, ME, FPR techniques in cervical, thoracic and rib areas  Patient tolerated the procedure well with improvement in symptoms  Patient given exercises, stretches and lifestyle modifications  See medications in patient instructions if given  Patient will follow up in 4-6 weeks    

## 2015-06-07 NOTE — Progress Notes (Signed)
  [facility] [address] [place], [place] [address] Phone: [phone] Subjective:      CC: Bilateral shoulder pain upper back pain  [id number] Kristie Fitzgerald is a 24 y.o. female coming in with complaint of bilateral shoulder pain. Patient is found to have more of a muscle imbalances as well as weakness of the upper back.patient has been doing the exercises and has been feeling better. Patient has noticed that she is not having as much tenderness. Patient has noticed though that with steady more often she has a little bit of tightness. Nothing that is stopping her from any activities. Continues to be very active.  Past Medical History  Diagnosis Date  . Precancerous skin lesion     REMOVED  . Frequent UTI   . Anxiety   . Mass     ON BACK   Past Surgical History  Procedure Laterality Date  . Wisdon teeth      REMOVED  . Mass excision N/A 03/22/2013    Procedure: EXCISION MASS;  Surgeon: Rolm Bookbinder, MD;  Location: [facility];  Service: General;  Laterality: N/A;   Social History  Substance Use Topics  . Smoking status: Never Smoker   . Smokeless tobacco: None  . Alcohol Use: Yes     Comment: SOCIAL   Family History  Problem Relation Age of Onset  . Cancer Father     meloanoma  . Depression Other    No Known Allergies      Past medical history, social, surgical and family history all reviewed in electronic medical record.   Review of Systems: No headache, visual changes, nausea, vomiting, diarrhea, constipation, dizziness, abdominal pain, skin rash, fevers, chills, night sweats, weight loss, swollen lymph nodes, body aches, joint swelling, muscle aches, chest pain, shortness of breath, mood changes.   Objective Blood pressure 112/80, pulse 108, height 5\' 9"  (1.753 m), weight 187 lb (84.823 kg), SpO2 97 %.  General: No apparent distress alert and oriented x3 mood and affect normal, dressed appropriately.  HEENT: Pupils equal,  extraocular movements intact  Respiratory: Patient's speak in full sentences and does not appear short of breath  Cardiovascular: No lower extremity edema, non tender, no erythema  Skin: Warm dry intact with no signs of infection or rash on extremities or on axial skeleton.  Abdomen: Soft nontender  Neuro: Cranial nerves II through XII are intact, neurovascularly intact in all extremities with 2+ DTRs and 2+ pulses.  Lymph: No lymphadenopathy of posterior or anterior cervical chain or axillae bilaterally.  Gait normal with good balance and coordination.  MSK:  Non tender with full range of motion and good stability and symmetric strength and tone of  elbows, wrist, hip, knee and ankles bilaterally.  Neck: Inspection unremarkable. Increasing kyphosis of the upper thoracic still present No palpable stepoffs. Negative Spurling's maneuver. Full neck range of motion Grip strength and sensation normal in bilateral hands Strength good C4 to T1 distribution No sensory change to C4 to T1 Negative Hoffman sign bilaterally Reflexes normal Improved range of motion of the shoulders bilaterally with no impingement signs   Osteopathic findings C3 flexed rotated and side bent left C7 flexed rotated and side bent right T1 flexed rotated and side bent right T3 extended rotated and side bent left with inhaled third rib      Impression and Recommendations:     This case required medical decision making of moderate complexity.

## 2015-06-07 NOTE — Assessment & Plan Note (Signed)
Patient overall is doing relatively better. Patient has been not not doing the icing of the home exercises on a regular basis and we have encouraged her to do some. We discussed icing, home exercises, which activities to doing which ones to potentially avoid. We discussed over-the-counter natural supplements to help her deal with some anxiety and pain that can be psychosomatic. Patient will continue to work on posture. We'll see patient again in 4-6 weeks for further evaluation and treatment.

## 2015-06-07 NOTE — Patient Instructions (Addendum)
Consider good shoes with arch support Good shoes with rigid bottom.  Jalene Mullet, [place]rell or New balance greater then [employer] is your friend when you need it When studying try to take breaks every 45 minutes or so for 5 minutes  Continue the vitamin D Choline 500mg  daily See me again 4-6 weeks.

## 2015-06-07 NOTE — Progress Notes (Signed)
Pre visit review using our clinic review tool, if applicable. No additional management support is needed unless otherwise documented below in the visit note. 

## 2015-06-14 ENCOUNTER — Other Ambulatory Visit: Payer: Self-pay | Admitting: Family Medicine

## 2015-06-14 MED ORDER — NITROFURANTOIN MONOHYD MACRO 100 MG PO CAPS: ORAL_CAPSULE | ORAL | Status: DC

## 2015-06-24 DIAGNOSIS — J358 Other chronic diseases of tonsils and adenoids: Secondary | ICD-10-CM

## 2015-06-24 DIAGNOSIS — J3501 Chronic tonsillitis: Secondary | ICD-10-CM

## 2015-06-24 HISTORY — DX: Other chronic diseases of tonsils and adenoids: J35.8

## 2015-06-24 HISTORY — DX: Chronic tonsillitis: J35.01

## 2015-06-26 ENCOUNTER — Encounter (HOSPITAL_BASED_OUTPATIENT_CLINIC_OR_DEPARTMENT_OTHER): Payer: Self-pay | Admitting: *Deleted

## 2015-06-28 ENCOUNTER — Other Ambulatory Visit (HOSPITAL_COMMUNITY): Payer: Self-pay | Admitting: Otolaryngology

## 2015-06-29 ENCOUNTER — Other Ambulatory Visit (HOSPITAL_COMMUNITY): Payer: Self-pay | Admitting: Otolaryngology

## 2015-06-30 ENCOUNTER — Ambulatory Visit: Payer: Self-pay | Admitting: Otolaryngology

## 2015-06-30 NOTE — H&P (Signed)
Assessment  Chronic tonsillitis (474.00) (J35.01). Tonsillar calculus (474.8) (J35.8). Discussed  Chronic tonsil lithiasis with chronic tonsillitis. She has been using conservative measures for a few years and problem seems to be getting worse. She is interested in tonsillectomy. I think this is reasonable. Risks and benefits were discussed in detail. We will schedule at her convenience. Reason For Visit  Kristie Fitzgerald is here today at the kind request of [name] for consultation and opinion for tonsils. HPI  Long history of tonsil stones. Last 2 or 3 years have been especially bad in the problem seems to be getting worse. She has to cleanout debris frequently. She has chronic sore throats. She has had some bleeding related to this. Otherwise in good health. She is interested in tonsillectomy. Allergies  No Known Drug Allergies. Current Meds  CloNIDine HCl TABS;; RPT Robaxin TABS (Methocarbamol);; RPT PredniSONE TABS;; RPT Adderall TABS (Amphetamine-Dextroamphetamine);; RPT Citalopram Hydrobromide 20 MG Oral Tablet;; RPT. PSH  Oral Surgery Tooth Extraction Surgery Excision Lipoma. Family Hx  Family history of blood clots: Paternal Grandmother (V18.3) (Z82.49) Family history of melanoma: Father (V16.8) (Z80.8). Personal Hx  Caffeine use (V49.89) (F15.90); 2 cups daily Never smoker Non-smoker (V49.89) (Z78.9) Social alcohol use (Z78.9). ROS  Systemic: Feeling tired (fatigue).  No fever, no night sweats, and no recent weight loss. Head: No headache. Eyes: No eye symptoms. Otolaryngeal: No hearing loss, no earache, no tinnitus, and no purulent nasal discharge.  No nasal passage blockage (stuffiness), no snoring, no sneezing, no hoarseness, and no sore throat. Cardiovascular: No chest pain or discomfort  and no palpitations. Pulmonary: No dyspnea, no cough, and no wheezing. Gastrointestinal: No dysphagia  and no heartburn.  No nausea, no abdominal pain, and no melena.  No  diarrhea. Genitourinary: No dysuria. Endocrine: No muscle weakness. Musculoskeletal: No calf muscle cramps, no arthralgias, and no soft tissue swelling. Neurological: No dizziness, no fainting, no tingling, and no numbness. Psychological: No anxiety  and no depression. Skin: No rash. 12 system ROS was obtained and reviewed on the Health Maintenance form dated today.  Positive responses are shown above.  If the symptom is not checked, the patient has denied it. Vital Signs   Recorded by Iran Sizer on 10 May 2015 09:59 AM BP:108/70,  BSA Calculated: 2.00 ,  BMI Calculated: 27.47 ,  Weight: 186 lb , BMI: 27.5 kg/m2,  Height: 5 ft 9 in. Physical Exam  APPEARANCE: Well developed, well nourished, in no acute distress.  Normal affect, in a pleasant mood.  Oriented to time, place and person. COMMUNICATION: Normal voice   HEAD & FACE:  No scars, lesions or masses of head and face.  Sinuses nontender to palpation.  Salivary glands without mass or tenderness.  Facial strength symmetric.  No facial lesion, scars, or mass. EYES: EOMI with normal primary gaze alignment. Visual acuity grossly intact.  PERRLA EXTERNAL EAR & NOSE: No scars, lesions or masses  EAC & TYMPANIC MEMBRANE:  EAC shows no obstructing lesions or debris and tympanic membranes are normal bilaterally with good movement to insufflation. GROSS HEARING: Normal   TMJ:  Nontender  INTRANASAL EXAM: No polyps or purulence.  NASOPHARYNX: Normal, without lesions. LIPS, TEETH & GUMS: No lip lesions, normal dentition and normal gums. ORAL CAVITY/OROPHARYNX:  Oral mucosa moist without lesion or asymmetry of the palate, tongue, tonsil or posterior pharynx. Tonsils are 3+ enlarged with cryptic spaces. NECK:  Supple without adenopathy or mass. THYROID:  Normal with no masses palpable.  NEUROLOGIC:  No gross  CN deficits. No nystagmus noted.   LYMPHATIC:  No enlarged nodes palpable. Signature  Electronically signed by : Izora Gala   M.D.; 05/10/2015 10:07 AM EST. Reviewed by : Christain Sacramento  M.D.; 05/20/2015 6:16 PM EST.

## 2015-06-30 NOTE — Addendum Note (Signed)
Addended by: Izora Gala on: 06/30/2015 09:45 AM   Modules accepted: Orders

## 2015-07-02 NOTE — Anesthesia Preprocedure Evaluation (Signed)
Anesthesia Evaluation  Patient identified by MRN, date of birth, ID band Patient awake    Reviewed: Allergy & Precautions, H&P , NPO status , Patient's Chart, lab work & pertinent test results  Airway Mallampati: II  TM Distance: <3 FB Neck ROM: Full    Dental no notable dental hx.    Pulmonary neg pulmonary ROS,    Pulmonary exam normal breath sounds clear to auscultation       Cardiovascular negative cardio ROS Normal cardiovascular exam Rhythm:Regular Rate:Normal     Neuro/Psych Anxiety Depression negative neurological ROS     GI/Hepatic negative GI ROS, Neg liver ROS,   Endo/Other  negative endocrine ROS  Renal/GU negative Renal ROS  negative genitourinary   Musculoskeletal negative musculoskeletal ROS (+)   Abdominal   Peds negative pediatric ROS (+)  Hematology negative hematology ROS (+)   Anesthesia Other Findings   Reproductive/Obstetrics negative OB ROS                             Anesthesia Physical  Anesthesia Plan  ASA: II  Anesthesia Plan: General   Post-op Pain Management:    Induction: Intravenous  Airway Management Planned: Oral ETT  Additional Equipment:   Intra-op Plan:   Post-operative Plan: Extubation in OR  Informed Consent: I have reviewed the patients History and Physical, chart, labs and discussed the procedure including the risks, benefits and alternatives for the proposed anesthesia with the patient or authorized representative who has indicated his/her understanding and acceptance.   Dental advisory given  Plan Discussed with: CRNA and Surgeon  Anesthesia Plan Comments:         Anesthesia Quick Evaluation

## 2015-07-03 ENCOUNTER — Encounter (HOSPITAL_BASED_OUTPATIENT_CLINIC_OR_DEPARTMENT_OTHER)
Admission: RE | Disposition: A | Discharge status: Home / Self Care | Payer: Self-pay | Source: Ambulatory Visit | Attending: Otolaryngology

## 2015-07-03 ENCOUNTER — Ambulatory Visit (HOSPITAL_BASED_OUTPATIENT_CLINIC_OR_DEPARTMENT_OTHER): Payer: 59 | Admitting: Certified Registered"

## 2015-07-03 ENCOUNTER — Encounter (HOSPITAL_BASED_OUTPATIENT_CLINIC_OR_DEPARTMENT_OTHER): Payer: Self-pay | Admitting: *Deleted

## 2015-07-03 ENCOUNTER — Ambulatory Visit (HOSPITAL_BASED_OUTPATIENT_CLINIC_OR_DEPARTMENT_OTHER)
Admission: RE | Admit: 2015-07-03 | Discharge: 2015-07-03 | Disposition: A | Discharge status: Home / Self Care | Payer: 59 | Source: Ambulatory Visit | Attending: Otolaryngology | Admitting: Otolaryngology

## 2015-07-03 DIAGNOSIS — J3501 Chronic tonsillitis: Secondary | ICD-10-CM | POA: Diagnosis not present

## 2015-07-03 DIAGNOSIS — F418 Other specified anxiety disorders: Secondary | ICD-10-CM | POA: Diagnosis not present

## 2015-07-03 DIAGNOSIS — J358 Other chronic diseases of tonsils and adenoids: Secondary | ICD-10-CM | POA: Diagnosis not present

## 2015-07-03 HISTORY — DX: Other chronic diseases of tonsils and adenoids: J35.8

## 2015-07-03 HISTORY — DX: Dental restoration status: Z98.811

## 2015-07-03 HISTORY — DX: Other seasonal allergic rhinitis: J30.2

## 2015-07-03 HISTORY — PX: TONSILLECTOMY: SHX5217

## 2015-07-03 HISTORY — DX: Other specified behavioral and emotional disorders with onset usually occurring in childhood and adolescence: F98.8

## 2015-07-03 HISTORY — DX: Chronic tonsillitis: J35.01

## 2015-07-03 LAB — POCT HEMOGLOBIN-HEMACUE: HEMOGLOBIN: 12.5 g/dL (ref 12.0–15.0)

## 2015-07-03 SURGERY — TONSILLECTOMY
Anesthesia: General | Site: Throat

## 2015-07-03 MED ORDER — LACTATED RINGERS IV SOLN
INTRAVENOUS | Status: DC | PRN
  Administered 2015-07-03 (×2): via INTRAVENOUS

## 2015-07-03 MED ORDER — MIDAZOLAM HCL 5 MG/5ML IJ SOLN
INTRAMUSCULAR | Status: DC | PRN
  Administered 2015-07-03: 2 mg via INTRAVENOUS

## 2015-07-03 MED ORDER — SUCCINYLCHOLINE CHLORIDE 20 MG/ML IJ SOLN
INTRAMUSCULAR | Status: DC | PRN
  Administered 2015-07-03: 100 mg via INTRAVENOUS

## 2015-07-03 MED ORDER — HYDROMORPHONE HCL 1 MG/ML IJ SOLN
INTRAMUSCULAR | Status: AC
  Filled 2015-07-03: qty 1

## 2015-07-03 MED ORDER — MIDAZOLAM HCL 2 MG/2ML IJ SOLN
INTRAMUSCULAR | Status: AC
  Filled 2015-07-03: qty 4

## 2015-07-03 MED ORDER — LIDOCAINE HCL (CARDIAC) 20 MG/ML IV SOLN
INTRAVENOUS | Status: DC | PRN
  Administered 2015-07-03: 60 mg via INTRAVENOUS

## 2015-07-03 MED ORDER — PROMETHAZINE HCL 25 MG RE SUPP: 25.0000 mg | Freq: Four times a day (QID) | RECTAL | Status: DC | PRN

## 2015-07-03 MED ORDER — PROPOFOL 500 MG/50ML IV EMUL
INTRAVENOUS | Status: AC
  Filled 2015-07-03: qty 50

## 2015-07-03 MED ORDER — ONDANSETRON HCL 4 MG/2ML IJ SOLN
INTRAMUSCULAR | Status: AC
  Filled 2015-07-03: qty 2

## 2015-07-03 MED ORDER — DEXAMETHASONE SODIUM PHOSPHATE 4 MG/ML IJ SOLN
INTRAMUSCULAR | Status: DC | PRN
  Administered 2015-07-03: 10 mg via INTRAVENOUS

## 2015-07-03 MED ORDER — GLYCOPYRROLATE 0.2 MG/ML IJ SOLN: 0.2000 mg | Freq: Once | INTRAMUSCULAR | Status: DC | PRN

## 2015-07-03 MED ORDER — HYDROMORPHONE HCL 1 MG/ML IJ SOLN
0.2500 mg | INTRAMUSCULAR | Status: DC | PRN
  Administered 2015-07-03 (×4): 0.5 mg via INTRAVENOUS

## 2015-07-03 MED ORDER — MIDAZOLAM HCL 2 MG/2ML IJ SOLN: 1.0000 mg | INTRAMUSCULAR | Status: DC | PRN

## 2015-07-03 MED ORDER — PROMETHAZINE HCL 25 MG/ML IJ SOLN: 6.2500 mg | INTRAMUSCULAR | Status: DC | PRN

## 2015-07-03 MED ORDER — FENTANYL CITRATE (PF) 100 MCG/2ML IJ SOLN
INTRAMUSCULAR | Status: DC | PRN
  Administered 2015-07-03: 25 ug via INTRAVENOUS
  Administered 2015-07-03: 50 ug via INTRAVENOUS
  Administered 2015-07-03: 25 ug via INTRAVENOUS

## 2015-07-03 MED ORDER — LIDOCAINE HCL (CARDIAC) 20 MG/ML IV SOLN
INTRAVENOUS | Status: AC
  Filled 2015-07-03: qty 5

## 2015-07-03 MED ORDER — ONDANSETRON HCL 4 MG/2ML IJ SOLN
INTRAMUSCULAR | Status: DC | PRN
  Administered 2015-07-03: 4 mg via INTRAVENOUS

## 2015-07-03 MED ORDER — HYDROCODONE-ACETAMINOPHEN 7.5-325 MG/15ML PO SOLN: 15.0000 mL | Freq: Four times a day (QID) | ORAL | Status: DC | PRN

## 2015-07-03 MED ORDER — SUCCINYLCHOLINE CHLORIDE 20 MG/ML IJ SOLN
INTRAMUSCULAR | Status: AC
  Filled 2015-07-03: qty 1

## 2015-07-03 MED ORDER — LACTATED RINGERS IV SOLN
INTRAVENOUS | Status: DC
  Administered 2015-07-03: 07:00:00 via INTRAVENOUS

## 2015-07-03 MED ORDER — FENTANYL CITRATE (PF) 100 MCG/2ML IJ SOLN: 50.0000 ug | INTRAMUSCULAR | Status: DC | PRN

## 2015-07-03 MED ORDER — DEXAMETHASONE SODIUM PHOSPHATE 10 MG/ML IJ SOLN
INTRAMUSCULAR | Status: AC
  Filled 2015-07-03: qty 1

## 2015-07-03 MED ORDER — SCOPOLAMINE 1 MG/3DAYS TD PT72: 1.0000 | MEDICATED_PATCH | Freq: Once | TRANSDERMAL | Status: DC | PRN

## 2015-07-03 MED ORDER — MEPERIDINE HCL 25 MG/ML IJ SOLN: 6.2500 mg | INTRAMUSCULAR | Status: DC | PRN

## 2015-07-03 MED ORDER — PROPOFOL 10 MG/ML IV BOLUS
INTRAVENOUS | Status: DC | PRN
  Administered 2015-07-03: 200 mg via INTRAVENOUS

## 2015-07-03 MED ORDER — FENTANYL CITRATE (PF) 100 MCG/2ML IJ SOLN
INTRAMUSCULAR | Status: AC
  Filled 2015-07-03: qty 4

## 2015-07-03 SURGICAL SUPPLY — 26 items
CANISTER SUCT 1200ML W/VALVE (MISCELLANEOUS) ×2 IMPLANT
CATH ROBINSON RED A/P 12FR (CATHETERS) ×2 IMPLANT
COAGULATOR SUCT SWTCH 10FR 6 (ELECTROSURGICAL) ×2 IMPLANT
COVER MAYO STAND STRL (DRAPES) ×2 IMPLANT
ELECT COATED BLADE 2.86 ST (ELECTRODE) ×2 IMPLANT
ELECT REM PT RETURN 9FT ADLT (ELECTROSURGICAL) ×2
ELECT REM PT RETURN 9FT PED (ELECTROSURGICAL)
ELECTRODE REM PT RETRN 9FT PED (ELECTROSURGICAL) IMPLANT
ELECTRODE REM PT RTRN 9FT ADLT (ELECTROSURGICAL) ×1 IMPLANT
GLOVE ECLIPSE 6.5 STRL STRAW (GLOVE) ×2 IMPLANT
GLOVE ECLIPSE 7.5 STRL STRAW (GLOVE) ×2 IMPLANT
GOWN STRL REUS W/ TWL LRG LVL3 (GOWN DISPOSABLE) ×2 IMPLANT
GOWN STRL REUS W/TWL LRG LVL3 (GOWN DISPOSABLE) ×2
MARKER SKIN DUAL TIP RULER LAB (MISCELLANEOUS) IMPLANT
NS IRRIG 1000ML POUR BTL (IV SOLUTION) ×2 IMPLANT
PENCIL FOOT CONTROL (ELECTRODE) ×2 IMPLANT
SHEET MEDIUM DRAPE 40X70 STRL (DRAPES) ×2 IMPLANT
SOLUTION BUTLER CLEAR DIP (MISCELLANEOUS) ×2 IMPLANT
SPONGE GAUZE 4X4 12PLY STER LF (GAUZE/BANDAGES/DRESSINGS) ×2 IMPLANT
SPONGE TONSIL 1 RF SGL (DISPOSABLE) IMPLANT
SPONGE TONSIL 1.25 RF SGL STRG (GAUZE/BANDAGES/DRESSINGS) IMPLANT
SYR BULB 3OZ (MISCELLANEOUS) ×2 IMPLANT
TOWEL OR 17X24 6PK STRL BLUE (TOWEL DISPOSABLE) ×2 IMPLANT
TUBE CONNECTING 20X1/4 (TUBING) ×2 IMPLANT
TUBE SALEM SUMP 12R W/ARV (TUBING) IMPLANT
TUBE SALEM SUMP 16 FR W/ARV (TUBING) ×2 IMPLANT

## 2015-07-03 NOTE — Discharge Instructions (Signed)
Tonsillectomy, Adult, Care After Refer to this sheet in the next few weeks. These instructions provide you with information on caring for yourself after your procedure. Your health care provider may also give you more specific instructions. Your treatment has been planned according to current medical practices, but problems sometimes occur. Call your health care provider if you have any problems or questions after your procedure. WHAT TO EXPECT AFTER THE PROCEDURE After your procedure, it is typical to have the following:  Your tongue will be numb, and your sense of taste will be reduced.  Your swallowing will be difficult and painful.  Your jaw may hurt or make a clicking noise when you yawn or chew.  Liquids that you drink may leak out of your nose.  Your voice may sound muffled.  The area at the middle of the roof of your mouth (uvula) may be very swollen.  You may have a constant cough and need to clear mucus and phlegm from your throat. HOME CARE INSTRUCTIONS   Get proper rest, keeping your head elevated at all times.  Drink plenty of fluids. This reduces pain and hastens the healing process.  Take medicines only as directed by your health care provider.  Soft and cold foods, such as gelatin, sherbet, ice cream, frozen ice pops, and cold drinks, are usually the easiest to eat. Several days after surgery, you will be able to eat more solid food.  Avoid mouthwashes and gargles.  Avoid contact with people who have upper respiratory infections, such as colds and sore throats. SEEK MEDICAL CARE IF:   You have increasing pain that is not controlled with medicines.  You have a fever.  You have a rash.  You feel light-headed or faint.  You are unable to swallow even small amounts of liquid or saliva.  Your urine is becoming very dark. SEEK IMMEDIATE MEDICAL CARE IF:   You have difficulty breathing.  You experience side effects or allergic reactions to medicines.  You  bleed bright red blood from your throat, or you vomit bright red blood.   This information is not intended to replace advice given to you by your health care provider. Make sure you discuss any questions you have with your health care provider.   Document Released: 07/10/2004 Document Revised: 01/24/2015 Document Reviewed: 04/06/2013 Elsevier Interactive Patient Education 2016 [place] Anesthesia Home Care Instructions  Activity: Get plenty of rest for the remainder of the day. A responsible adult should stay with you for 24 hours following the procedure.  For the next 24 hours, DO NOT: -Drive a car -Paediatric nurse -Drink alcoholic beverages -Take any medication unless instructed by your physician -Make any legal decisions or sign important papers.  Meals: Start with liquid foods such as gelatin or soup. Progress to regular foods as tolerated. Avoid greasy, spicy, heavy foods. If nausea and/or vomiting occur, drink only clear liquids until the nausea and/or vomiting subsides. Call your physician if vomiting continues.  Special Instructions/Symptoms: Your throat may feel dry or sore from the anesthesia or the breathing tube placed in your throat during surgery. If this causes discomfort, gargle with warm salt water. The discomfort should disappear within 24 hours.  If you had a scopolamine patch placed behind your ear for the management of post- operative nausea and/or vomiting:  1. The medication in the patch is effective for 72 hours, after which it should be removed.  Wrap patch in a tissue and discard in the trash.  trash. Wash hands thoroughly with soap and water. °2. You may remove the patch earlier than 72 hours if you experience unpleasant side effects which may include dry mouth, dizziness or visual disturbances. °3. Avoid touching the patch. Wash your hands with soap and water after contact with the patch. °  ° °

## 2015-07-03 NOTE — H&P (View-Only) (Signed)
Assessment  Chronic tonsillitis (474.00) (J35.01). Tonsillar calculus (474.8) (J35.8). Discussed  Chronic tonsil lithiasis with chronic tonsillitis. She has been using conservative measures for a few years and problem seems to be getting worse. She is interested in tonsillectomy. I think this is reasonable. Risks and benefits were discussed in detail. We will schedule at her convenience. Reason For Visit  Kristie Fitzgerald is here today at the kind request of [name] for consultation and opinion for tonsils. HPI  Long history of tonsil stones. Last 2 or 3 years have been especially bad in the problem seems to be getting worse. She has to cleanout debris frequently. She has chronic sore throats. She has had some bleeding related to this. Otherwise in good health. She is interested in tonsillectomy. Allergies  No Known Drug Allergies. Current Meds  CloNIDine HCl TABS;; RPT Robaxin TABS (Methocarbamol);; RPT PredniSONE TABS;; RPT Adderall TABS (Amphetamine-Dextroamphetamine);; RPT Citalopram Hydrobromide 20 MG Oral Tablet;; RPT. PSH  Oral Surgery Tooth Extraction Surgery Excision Lipoma. Family Hx  Family history of blood clots: Paternal Grandmother (V18.3) (Z82.49) Family history of melanoma: Father (V16.8) (Z80.8). Personal Hx  Caffeine use (V49.89) (F15.90); 2 cups daily Never smoker Non-smoker (V49.89) (Z78.9) Social alcohol use (Z78.9). ROS  Systemic: Feeling tired (fatigue).  No fever, no night sweats, and no recent weight loss. Head: No headache. Eyes: No eye symptoms. Otolaryngeal: No hearing loss, no earache, no tinnitus, and no purulent nasal discharge.  No nasal passage blockage (stuffiness), no snoring, no sneezing, no hoarseness, and no sore throat. Cardiovascular: No chest pain or discomfort  and no palpitations. Pulmonary: No dyspnea, no cough, and no wheezing. Gastrointestinal: No dysphagia  and no heartburn.  No nausea, no abdominal pain, and no melena.  No  diarrhea. Genitourinary: No dysuria. Endocrine: No muscle weakness. Musculoskeletal: No calf muscle cramps, no arthralgias, and no soft tissue swelling. Neurological: No dizziness, no fainting, no tingling, and no numbness. Psychological: No anxiety  and no depression. Skin: No rash. 12 system ROS was obtained and reviewed on the Health Maintenance form dated today.  Positive responses are shown above.  If the symptom is not checked, the patient has denied it. Vital Signs   Recorded by Iran Sizer on 10 May 2015 09:59 AM BP:108/70,  BSA Calculated: 2.00 ,  BMI Calculated: 27.47 ,  Weight: 186 lb , BMI: 27.5 kg/m2,  Height: 5 ft 9 in. Physical Exam  APPEARANCE: Well developed, well nourished, in no acute distress.  Normal affect, in a pleasant mood.  Oriented to time, place and person. COMMUNICATION: Normal voice   HEAD & FACE:  No scars, lesions or masses of head and face.  Sinuses nontender to palpation.  Salivary glands without mass or tenderness.  Facial strength symmetric.  No facial lesion, scars, or mass. EYES: EOMI with normal primary gaze alignment. Visual acuity grossly intact.  PERRLA EXTERNAL EAR & NOSE: No scars, lesions or masses  EAC & TYMPANIC MEMBRANE:  EAC shows no obstructing lesions or debris and tympanic membranes are normal bilaterally with good movement to insufflation. GROSS HEARING: Normal   TMJ:  Nontender  INTRANASAL EXAM: No polyps or purulence.  NASOPHARYNX: Normal, without lesions. LIPS, TEETH & GUMS: No lip lesions, normal dentition and normal gums. ORAL CAVITY/OROPHARYNX:  Oral mucosa moist without lesion or asymmetry of the palate, tongue, tonsil or posterior pharynx. Tonsils are 3+ enlarged with cryptic spaces. NECK:  Supple without adenopathy or mass. THYROID:  Normal with no masses palpable.  NEUROLOGIC:  No gross  CN deficits. No nystagmus noted.   LYMPHATIC:  No enlarged nodes palpable. Signature  Electronically signed by : Izora Gala   M.D.; 05/10/2015 10:07 AM EST. Reviewed by : Christain Sacramento  M.D.; 05/20/2015 6:16 PM EST.

## 2015-07-03 NOTE — Anesthesia Postprocedure Evaluation (Signed)
Anesthesia Post Note  Patient: Kristie Fitzgerald  Procedure(s) Performed: Procedure(s) (LRB): TONSILLECTOMY (N/A)  Anesthesia type: General  Patient location: PACU  Post pain: Pain level controlled  Post assessment: Post-op Vital signs reviewed  Last Vitals: BP 120/75 mmHg  Pulse 76  Temp(Src) 36.6 C (Oral)  Resp 14  Ht 5\' 9"  (1.753 m)  Wt 186 lb (84.369 kg)  BMI 27.45 kg/m2  SpO2 100%  LMP 06/24/2015 (Exact Date)  Post vital signs: Reviewed  Level of consciousness: sedated  Complications: No apparent anesthesia complications

## 2015-07-03 NOTE — Transfer of Care (Signed)
Immediate Anesthesia Transfer of Care Note  Patient: Kristie Fitzgerald  Procedure(s) Performed: Procedure(s): TONSILLECTOMY (N/A)  Patient Location: PACU  Anesthesia Type:General  Level of Consciousness: awake, alert , oriented and patient cooperative  Airway & Oxygen Therapy: Patient Spontanous Breathing and Patient connected to face mask oxygen  Post-op Assessment: Report given to RN and Post -op Vital signs reviewed and stable  Post vital signs: Reviewed and stable  Last Vitals:  Filed Vitals:   07/03/15 0749  BP: 109/60  Pulse: 76  Temp: 36.6 C  Resp: 18    Complications: No apparent anesthesia complications

## 2015-07-03 NOTE — Interval H&P Note (Signed)
History and Physical Interval Note:  07/03/2015 8:06 AM  Kristie Fitzgerald  has presented today for surgery, with the diagnosis of CHRONIC TONSILLITIS,TONSIL CALCULUS  The various methods of treatment have been discussed with the patient and family. After consideration of risks, benefits and other options for treatment, the patient has consented to  Procedure(s): TONSILLECTOMY (N/A) as a surgical intervention .  The patient's history has been reviewed, patient examined, no change in status, stable for surgery.  I have reviewed the patient's chart and labs.  Questions were answered to the patient's satisfaction.     [name]

## 2015-07-03 NOTE — Anesthesia Procedure Notes (Signed)
Procedure Name: Intubation Date/Time: 07/03/2015 7:42 AM Performed by: Soyla Bainter D Pre-anesthesia Checklist: Patient identified, Emergency Drugs available, Suction available and Patient being monitored Patient Re-evaluated:Patient Re-evaluated prior to inductionOxygen Delivery Method: Circle System Utilized Preoxygenation: Pre-oxygenation with 100% oxygen Intubation Type: IV induction Ventilation: Mask ventilation without difficulty Laryngoscope Size: Mac and 3 Grade View: Grade I Tube type: Oral Tube size: 7.0 mm Number of attempts: 1 Airway Equipment and Method: Stylet and Oral airway Placement Confirmation: ETT inserted through vocal cords under direct vision,  positive ETCO2 and breath sounds checked- equal and bilateral Secured at: 21 cm Tube secured with: Tape Dental Injury: Teeth and Oropharynx as per pre-operative assessment

## 2015-07-03 NOTE — Op Note (Signed)
07/03/2015  8:03 AM  PATIENT:  Kristie Fitzgerald  24 y.o. female  PRE-OPERATIVE DIAGNOSIS:  CHRONIC TONSILLITIS,TONSIL CALCULUS  POST-OPERATIVE DIAGNOSIS:  CHRONIC TONSILLITIS,TONSIL CALCULUS  PROCEDURE:  Procedure(s): TONSILLECTOMY  SURGEON:  Surgeon(s): Izora Gala, MD  ANESTHESIA:   General  COUNTS: Correct   DICTATION: The patient was taken to the operating room and placed on the operating table in the supine position. Following induction of general endotracheal anesthesia, the table was turned and the patient was draped in a standard fashion. A Crowe-Davis mouthgag was inserted into the oral cavity and used to retract the tongue and mandible, then attached to the Mayo stand.  The tonsillectomy was then performed using electrocautery dissection, carefully dissecting the avascular plane between the capsule and constrictor muscles. Cautery was used for completion of hemostasis. The tonsils were discarded. The tonsils were small but cryptic and fibrotic.  The pharynx was irrigated with saline and suctioned. An oral gastric tube was used to aspirate the contents of the stomach. The patient was then awakened from anesthesia and transferred to PACU in stable condition.   PATIENT DISPOSITION:  To PACA, stable

## 2015-07-04 ENCOUNTER — Encounter (HOSPITAL_BASED_OUTPATIENT_CLINIC_OR_DEPARTMENT_OTHER): Payer: Self-pay | Admitting: Otolaryngology

## 2015-07-19 ENCOUNTER — Ambulatory Visit: Payer: 59 | Admitting: Family Medicine

## 2015-07-25 ENCOUNTER — Other Ambulatory Visit: Payer: 59

## 2015-07-26 ENCOUNTER — Encounter: Payer: Self-pay | Admitting: Family Medicine

## 2015-07-26 ENCOUNTER — Ambulatory Visit (INDEPENDENT_AMBULATORY_CARE_PROVIDER_SITE_OTHER): Payer: 59 | Admitting: Family Medicine

## 2015-07-26 VITALS — BP 114/68 | HR 70 | Ht 69.0 in | Wt 180.0 lb

## 2015-07-26 DIAGNOSIS — M899 Disorder of bone, unspecified: Secondary | ICD-10-CM | POA: Diagnosis not present

## 2015-07-26 DIAGNOSIS — M999 Biomechanical lesion, unspecified: Secondary | ICD-10-CM

## 2015-07-26 DIAGNOSIS — M9901 Segmental and somatic dysfunction of cervical region: Secondary | ICD-10-CM

## 2015-07-26 DIAGNOSIS — M9908 Segmental and somatic dysfunction of rib cage: Secondary | ICD-10-CM | POA: Diagnosis not present

## 2015-07-26 DIAGNOSIS — M9902 Segmental and somatic dysfunction of thoracic region: Secondary | ICD-10-CM | POA: Diagnosis not present

## 2015-07-26 NOTE — Progress Notes (Signed)
Pre visit review using our clinic review tool, if applicable. No additional management support is needed unless otherwise documented below in the visit note. 

## 2015-07-26 NOTE — Assessment & Plan Note (Signed)
Decision today to treat with OMT was based on Physical Exam  After verbal consent patient was treated with HVLA, ME, FPR techniques in cervical, thoracic and rib areas  Patient tolerated the procedure well with improvement in symptoms  Patient given exercises, stretches and lifestyle modifications  See medications in patient instructions if given  Patient will follow up in 6 weeks              

## 2015-07-26 NOTE — Assessment & Plan Note (Signed)
Patient has been doing remarkably well at this time. Encourage patient to continue to stay active. Discussed icing and home exercises. Encourage patient to continue to remain active. Think she will do continuing well and I would like to space her to 6-8 weeks for further further evaluation and possible manipulation.

## 2015-07-26 NOTE — Patient Instructions (Addendum)
I am so glad you are doing better.  Give your self a pat on your back! Conitnue to monitor the posture as much as you can.  Continue the vitamins Congrats on the new job See me again 6-8 weeks

## 2015-07-26 NOTE — Progress Notes (Signed)
[name] [address], [place] [address] Phone: [phone] Subjective:      CC: Bilateral shoulder pain upper back pain  Kristie Fitzgerald is a 24 y.o. female coming in with complaint of bilateral shoulder pain. Patient is found to have more of a muscle imbalances as well as weakness of the upper back.patient has been doing the exercises and has been feeling better. Patient even had a surgery for tonsillectomy as well as had a recent illness and still is feeling better. Trying to do the exercises regular. Discussed with patient about which activities to do an which was potentially avoid which patient has been trying. Patient will be switching jobs and think he'll be much more beneficial as well. No new symptoms.  Past Medical History  Diagnosis Date  . Frequent UTI   . Anxiety   . Chronic tonsillitis 06/2015  . Tonsillar calculus 06/2015  . Dental crown present   . Seasonal allergies   . ADD (attention deficit disorder)    Past Surgical History  Procedure Laterality Date  . Mass excision N/A 03/22/2013    Procedure: EXCISION MASS;  Surgeon: Rolm Bookbinder, MD;  Location: [facility];  Service: General;  Laterality: N/A;  . Wisdom tooth extraction    . Tonsillectomy N/A 07/03/2015    Procedure: TONSILLECTOMY;  Surgeon: Izora Gala, MD;  Location: [facility];  Service: ENT;  Laterality: N/A;   Social History  Substance Use Topics  . Smoking status: Never Smoker   . Smokeless tobacco: Never Used  . Alcohol Use: Yes     Comment: socially   Family History  Problem Relation Age of Onset  . Cancer Father     melanoma   No Known Allergies      Past medical history, social, surgical and family history all reviewed in electronic medical record.   Review of Systems: No headache, visual changes, nausea, vomiting, diarrhea, constipation, dizziness, abdominal pain, skin rash, fevers, chills, night sweats, weight  loss, swollen lymph nodes, body aches, joint swelling, muscle aches, chest pain, shortness of breath, mood changes.   Objective Blood pressure 114/68, pulse 70, height 5\' 9"  (1.753 m), weight 180 lb (81.647 kg), SpO2 96 %.  General: No apparent distress alert and oriented x3 mood and affect normal, dressed appropriately.  HEENT: Pupils equal, extraocular movements intact  Respiratory: Patient's speak in full sentences and does not appear short of breath  Cardiovascular: No lower extremity edema, non tender, no erythema  Skin: Warm dry intact with no signs of infection or rash on extremities or on axial skeleton.  Abdomen: Soft nontender  Neuro: Cranial nerves II through XII are intact, neurovascularly intact in all extremities with 2+ DTRs and 2+ pulses.  Lymph: No lymphadenopathy of posterior or anterior cervical chain or axillae bilaterally.  Gait normal with good balance and coordination.  MSK:  Non tender with full range of motion and good stability and symmetric strength and tone of  elbows, wrist, hip, knee and ankles bilaterally.  Neck: Inspection unremarkable. Better posture than previously No palpable stepoffs. Negative Spurling's maneuver. Full neck range of motion Grip strength and sensation normal in bilateral hands Strength good C4 to T1 distribution No sensory change to C4 to T1 Negative Hoffman sign bilaterally Reflexes normal    Osteopathic findings C3 flexed rotated and side bent left C7 flexed rotated and side bent right T1 flexed rotated and side bent right T3 extended rotated and side bent  left with inhaled third rib T7 extended rotated and side bent right L2 flexed rotated inside that right Sacrum left on left      Impression and Recommendations:     This case required medical decision making of moderate complexity.

## 2015-08-01 ENCOUNTER — Encounter: Payer: 59 | Admitting: Family Medicine

## 2015-08-09 ENCOUNTER — Other Ambulatory Visit (INDEPENDENT_AMBULATORY_CARE_PROVIDER_SITE_OTHER): Payer: 59

## 2015-08-09 DIAGNOSIS — Z0000 Encounter for general adult medical examination without abnormal findings: Secondary | ICD-10-CM | POA: Diagnosis not present

## 2015-08-09 LAB — POCT URINALYSIS DIPSTICK
Bilirubin, UA: NEGATIVE
GLUCOSE UA: NEGATIVE
KETONES UA: NEGATIVE
Leukocytes, UA: NEGATIVE
NITRITE UA: NEGATIVE
Protein, UA: NEGATIVE
SPEC GRAV UA: 1.02
UROBILINOGEN UA: 0.2
pH, UA: 7

## 2015-08-09 LAB — CBC WITH DIFFERENTIAL/PLATELET
BASOS ABS: 0 10*3/uL (ref 0.0–0.1)
Basophils Relative: 0.6 % (ref 0.0–3.0)
Eosinophils Absolute: 0.1 10*3/uL (ref 0.0–0.7)
Eosinophils Relative: 1.9 % (ref 0.0–5.0)
HEMATOCRIT: 35.8 % — AB (ref 36.0–46.0)
HEMOGLOBIN: 11.9 g/dL — AB (ref 12.0–15.0)
LYMPHS PCT: 31.3 % (ref 12.0–46.0)
Lymphs Abs: 2.4 10*3/uL (ref 0.7–4.0)
MCHC: 33 g/dL (ref 30.0–36.0)
MCV: 88.6 fl (ref 78.0–100.0)
MONOS PCT: 5.6 % (ref 3.0–12.0)
Monocytes Absolute: 0.4 10*3/uL (ref 0.1–1.0)
NEUTROS ABS: 4.6 10*3/uL (ref 1.4–7.7)
Neutrophils Relative %: 60.6 % (ref 43.0–77.0)
PLATELETS: 194 10*3/uL (ref 150.0–400.0)
RBC: 4.05 Mil/uL (ref 3.87–5.11)
RDW: 13.8 % (ref 11.5–15.5)
WBC: 7.6 10*3/uL (ref 4.0–10.5)

## 2015-08-09 LAB — HEPATIC FUNCTION PANEL
ALBUMIN: 3.9 g/dL (ref 3.5–5.2)
ALK PHOS: 57 U/L (ref 39–117)
ALT: 15 U/L (ref 0–35)
AST: 20 U/L (ref 0–37)
Bilirubin, Direct: 0.1 mg/dL (ref 0.0–0.3)
TOTAL PROTEIN: 6.2 g/dL (ref 6.0–8.3)
Total Bilirubin: 0.4 mg/dL (ref 0.2–1.2)

## 2015-08-09 LAB — BASIC METABOLIC PANEL
BUN: 10 mg/dL (ref 6–23)
CALCIUM: 9 mg/dL (ref 8.4–10.5)
CO2: 24 meq/L (ref 19–32)
Chloride: 106 mEq/L (ref 96–112)
Creatinine, Ser: 0.62 mg/dL (ref 0.40–1.20)
GFR: 123.99 mL/min (ref >60.00)
Glucose, Bld: 85 mg/dL (ref 70–99)
Potassium: 4.1 mEq/L (ref 3.5–5.1)
SODIUM: 139 meq/L (ref 135–145)

## 2015-08-09 LAB — LIPID PANEL
Cholesterol: 159 mg/dL (ref 0–200)
HDL: 62.7 mg/dL (ref >39.00)
LDL CALC: 79 mg/dL (ref 0–99)
NONHDL: 95.95
Total CHOL/HDL Ratio: 3
Triglycerides: 83 mg/dL (ref 0.0–149.0)
VLDL: 16.6 mg/dL (ref 0.0–40.0)

## 2015-08-09 LAB — TSH: TSH: 1.18 u[IU]/mL (ref 0.35–4.50)

## 2015-08-16 ENCOUNTER — Encounter: Payer: Self-pay | Admitting: Family Medicine

## 2015-08-16 ENCOUNTER — Other Ambulatory Visit (HOSPITAL_COMMUNITY)
Admission: RE | Admit: 2015-08-16 | Discharge: 2015-08-16 | Disposition: A | Discharge status: Home / Self Care | Payer: 59 | Source: Ambulatory Visit | Attending: Family Medicine | Admitting: Family Medicine

## 2015-08-16 ENCOUNTER — Ambulatory Visit (INDEPENDENT_AMBULATORY_CARE_PROVIDER_SITE_OTHER): Payer: 59 | Admitting: Family Medicine

## 2015-08-16 VITALS — BP 110/70 | Temp 98.6°F | Ht 69.0 in | Wt 188.0 lb

## 2015-08-16 DIAGNOSIS — Z01419 Encounter for gynecological examination (general) (routine) without abnormal findings: Secondary | ICD-10-CM | POA: Diagnosis present

## 2015-08-16 DIAGNOSIS — Z1151 Encounter for screening for human papillomavirus (HPV): Secondary | ICD-10-CM | POA: Insufficient documentation

## 2015-08-16 DIAGNOSIS — N6019 Diffuse cystic mastopathy of unspecified breast: Secondary | ICD-10-CM

## 2015-08-16 DIAGNOSIS — R319 Hematuria, unspecified: Secondary | ICD-10-CM

## 2015-08-16 DIAGNOSIS — N938 Other specified abnormal uterine and vaginal bleeding: Secondary | ICD-10-CM

## 2015-08-16 DIAGNOSIS — Z0000 Encounter for general adult medical examination without abnormal findings: Secondary | ICD-10-CM

## 2015-08-16 DIAGNOSIS — F329 Major depressive disorder, single episode, unspecified: Secondary | ICD-10-CM

## 2015-08-16 DIAGNOSIS — F32A Depression, unspecified: Secondary | ICD-10-CM

## 2015-08-16 LAB — POCT URINALYSIS DIPSTICK
Bilirubin, UA: NEGATIVE
GLUCOSE UA: NEGATIVE
Ketones, UA: NEGATIVE
LEUKOCYTES UA: NEGATIVE
NITRITE UA: NEGATIVE
Protein, UA: NEGATIVE
Spec Grav, UA: 1.01
UROBILINOGEN UA: 0.2
pH, UA: 6.5

## 2015-08-16 NOTE — Progress Notes (Signed)
Pre visit review using our clinic review tool, if applicable. No additional management support is needed unless otherwise documented below in the visit note. 

## 2015-08-16 NOTE — Progress Notes (Signed)
   Subjective:    Patient ID: Kristie Fitzgerald, female    DOB: [birth year], 24 y.o.   MRN: [id number]  HPI [name] is a 24 year old single female nonsmoker who comes in today for general physical examination  She has a history of mild depression on Celexa 20 mg daily  She takes clonidine 0.2 at bedtime for sleep  Because her light skin and light eyes are physical therapist recommended vitamin D. We'll check a vitamin D level  She takes BCPs because of dysfunctional uterine bleeding. She's had a history of abnormal Paps in the past. She's required no therapy up to this juncture.  She's dropped 10 pounds. We saw her about 6 weeks ago her weight was up 280 pounds. She's down to 170. She started a diet and exercise program.  In October 10 she had a tonsillectomy from Dr. Constance Holster because of large tonsils. She recovered with no sequelae.  Urinalysis shows some blood in her urine. We'll repeat her urine could she is asymptomatic  Also hemoglobin slightly low 11.9. She states her periods are still moderately heavily. 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   HENT: Negative.   Eyes: Negative.   Respiratory: Negative.   Cardiovascular: Negative.   Gastrointestinal: Negative.   Endocrine: Negative.   Genitourinary: Negative.   Musculoskeletal: Negative.   Skin: Negative.   Allergic/Immunologic: Negative.   Neurological: Negative.   Hematological: Negative.   Psychiatric/Behavioral: Negative.        Objective:   Physical Exam  Constitutional: She appears well-developed and well-nourished.  HENT:  Head: Normocephalic and atraumatic.  Right Ear: External ear normal.  Left Ear: External ear normal.  Nose: Nose normal.  Mouth/Throat: Oropharynx is clear and moist.  Eyes: EOM are normal. Pupils are equal, round, and reactive to light.  Neck:  Normal range of motion. Neck supple. No JVD present. No tracheal deviation present. No thyromegaly present.  Cardiovascular: Normal rate, regular rhythm, normal heart sounds and intact distal pulses.  Exam reveals no gallop and no friction rub.   No murmur heard. Pulmonary/Chest: Effort normal and breath sounds normal. No stridor. No respiratory distress. She has no wheezes. She has no rales. She exhibits no tenderness.  Abdominal: Soft. Bowel sounds are normal. She exhibits no distension and no mass. There is no tenderness. There is no rebound and no guarding.  Genitourinary: Vagina normal and uterus normal. Guaiac negative stool. No vaginal discharge found.  Bilateral breast exam normal except for multiple small fibrocystic changes. They're all soft rubbery movable and somewhat tender.  Cervix appears normal Pap smear was done  Musculoskeletal: Normal range of motion.  Lymphadenopathy:    She has no cervical adenopathy.  Neurological: She is alert. She has normal reflexes. No cranial nerve deficit. She exhibits normal muscle tone. Coordination normal.  Skin: Skin is warm and dry. No rash noted. No erythema. No pallor.  Psychiatric: She has a normal mood and affect. Her behavior is normal. Judgment and thought content normal.  Nursing note and vitals reviewed.         Assessment & Plan:  Healthy female  Dysfunction uterine bleeding..... Continue BCPs  History of mild depression........ continue Celexa  Overweight,,,,,,,, 10 pound weight loss with diet and exercise over the past 6 weeks continue that process  Fibrocystic breast changes,,,,,,,,, BSE monthly.

## 2015-08-16 NOTE — Patient Instructions (Signed)
Continue current medications  We will check a vitamin D level today and call you the report next week  Continue diet and exercise and weight loss............Marland Kitchen good job  Follow-up in one year sooner if any problems

## 2015-08-20 LAB — VITAMIN D 1,25 DIHYDROXY
Vitamin D 1, 25 (OH)2 Total: 73 pg/mL — ABNORMAL HIGH (ref 18–72)
Vitamin D2 1, 25 (OH)2: <8 pg/mL
Vitamin D3 1, 25 (OH)2: 73 pg/mL

## 2015-08-21 LAB — CYTOLOGY - PAP

## 2015-09-06 ENCOUNTER — Ambulatory Visit (INDEPENDENT_AMBULATORY_CARE_PROVIDER_SITE_OTHER): Payer: 59 | Admitting: Family Medicine

## 2015-09-06 ENCOUNTER — Encounter: Payer: Self-pay | Admitting: Family Medicine

## 2015-09-06 VITALS — BP 112/80 | HR 94 | Ht 69.0 in | Wt 185.0 lb

## 2015-09-06 DIAGNOSIS — M899 Disorder of bone, unspecified: Secondary | ICD-10-CM | POA: Diagnosis not present

## 2015-09-06 DIAGNOSIS — M9908 Segmental and somatic dysfunction of rib cage: Secondary | ICD-10-CM | POA: Diagnosis not present

## 2015-09-06 DIAGNOSIS — M9902 Segmental and somatic dysfunction of thoracic region: Secondary | ICD-10-CM

## 2015-09-06 DIAGNOSIS — M999 Biomechanical lesion, unspecified: Secondary | ICD-10-CM

## 2015-09-06 DIAGNOSIS — M9901 Segmental and somatic dysfunction of cervical region: Secondary | ICD-10-CM | POA: Diagnosis not present

## 2015-09-06 NOTE — Progress Notes (Signed)
Pre visit review using our clinic review tool, if applicable. No additional management support is needed unless otherwise documented below in the visit note. 

## 2015-09-06 NOTE — Progress Notes (Signed)
[facility] [place] [address] [place], [place] [address] Phone: [phone] Subjective:      CC: Bilateral shoulder pain upper back pain  [id number] Kristie Fitzgerald is a 24 y.o. female coming in with complaint of bilateral shoulder pain. Patient is found to have more of a muscle imbalances as well as weakness of the upper back.patient has been doing the exercises and has been feeling better. Patient recently has had increasing stress. Patient did not do well with her finals. Patient is going to take a semester off. Patient is working at this time but is significant computer more than usual. Patient describes the pain is more of a dull aching sensation. Seems to be more located on the left shoulder blade.  Past Medical History  Diagnosis Date  . Frequent UTI   . Anxiety   . Chronic tonsillitis 06/2015  . Tonsillar calculus 06/2015  . Dental crown present   . Seasonal allergies   . ADD (attention deficit disorder)    Past Surgical History  Procedure Laterality Date  . Mass excision N/A 03/22/2013    Procedure: EXCISION MASS;  Surgeon: Rolm Bookbinder, MD;  Location: [facility];  Service: General;  Laterality: N/A;  . Wisdom tooth extraction    . Tonsillectomy N/A 07/03/2015    Procedure: TONSILLECTOMY;  Surgeon: Izora Gala, MD;  Location: [facility];  Service: ENT;  Laterality: N/A;   Social History  Substance Use Topics  . Smoking status: Never Smoker   . Smokeless tobacco: Never Used  . Alcohol Use: Yes     Comment: socially   Family History  Problem Relation Age of Onset  . Cancer Father     melanoma   No Known Allergies      Past medical history, social, surgical and family history all reviewed in electronic medical record.   Review of Systems: No headache, visual changes, nausea, vomiting, diarrhea, constipation, dizziness, abdominal pain, skin rash, fevers, chills, night sweats, weight loss, swollen lymph nodes, body  aches, joint swelling, muscle aches, chest pain, shortness of breath, mood changes.   Objective Blood pressure 112/80, pulse 94, height 5\' 9"  (1.753 m), weight 185 lb (83.915 kg), last menstrual period 07/19/2015, SpO2 98 %.  General: No apparent distress alert and oriented x3 mood and affect normal, dressed appropriately.  HEENT: Pupils equal, extraocular movements intact  Respiratory: Patient's speak in full sentences and does not appear short of breath  Cardiovascular: No lower extremity edema, non tender, no erythema  Skin: Warm dry intact with no signs of infection or rash on extremities or on axial skeleton.  Abdomen: Soft nontender  Neuro: Cranial nerves II through XII are intact, neurovascularly intact in all extremities with 2+ DTRs and 2+ pulses.  Lymph: No lymphadenopathy of posterior or anterior cervical chain or axillae bilaterally.  Gait normal with good balance and coordination.  MSK:  Non tender with full range of motion and good stability and symmetric strength and tone of  elbows, wrist, hip, knee and ankles bilaterally.  Neck: Inspection unremarkable. Still poor posture overall. Negative Spurling's maneuver. Full neck range of motion mild increase in stiffness from previous exam. Grip strength and sensation normal in bilateral hands Strength good C4 to T1 distribution No sensory change to C4 to T1 Negative Hoffman sign bilaterally Reflexes normal    Osteopathic findings C3 flexed rotated and side bent left C7 flexed rotated and side bent right T3 extended rotated and side bent left with inhaled  third rib T7 extended rotated and side bent right L2 flexed rotated inside that right L4 flexed rotated and side bent left Sacrum left on left      Impression and Recommendations:     This case required medical decision making of moderate complexity.

## 2015-09-06 NOTE — Assessment & Plan Note (Signed)
Decision today to treat with OMT was based on Physical Exam  After verbal consent patient was treated with HVLA, ME, FPR techniques in cervical, thoracic and rib areas  Patient tolerated the procedure well with improvement in symptoms  Patient given exercises, stretches and lifestyle modifications  See medications in patient instructions if given  Patient will follow up in 6 weeks              

## 2015-09-06 NOTE — Patient Instructions (Signed)
See you soon Happy holidays!  Get time for yourself and you posture See you in 6 weeks

## 2015-09-06 NOTE — Assessment & Plan Note (Signed)
Patient does have some scapular dysfunction still as well as a poor posture. We discussed icing regimen and home exercises. We discussed which activities to do an which was potentially avoid. Patient will do more postured changes. Patient thinks now that she is taking some time off from work she will do much better overall. Patient come back and see me again in 6 weeks for further evaluation and treatment.

## 2015-09-28 ENCOUNTER — Telehealth: Payer: Self-pay | Admitting: Family Medicine

## 2015-09-28 NOTE — Telephone Encounter (Signed)
Ibuprofen 800mg  3 times daily  Ice See me again next week or if too painful we can try Friday.

## 2015-09-28 NOTE — Telephone Encounter (Signed)
Shoulders are extremely tight and painful from sleeping on bad mattress over the holidays. Aspirin and advil not working Wondering if there is anything else she can do or if its ok for her to get a message? She can be reached at  [phone]

## 2015-09-28 NOTE — Telephone Encounter (Signed)
Discussed with pt, scheduled her for 1.12.17 @ 4pm.

## 2015-10-05 ENCOUNTER — Ambulatory Visit (INDEPENDENT_AMBULATORY_CARE_PROVIDER_SITE_OTHER): Payer: 59 | Admitting: Family Medicine

## 2015-10-05 ENCOUNTER — Encounter: Payer: Self-pay | Admitting: Family Medicine

## 2015-10-05 VITALS — BP 122/82 | HR 69

## 2015-10-05 DIAGNOSIS — M899 Disorder of bone, unspecified: Secondary | ICD-10-CM

## 2015-10-05 DIAGNOSIS — M6248 Contracture of muscle, other site: Secondary | ICD-10-CM

## 2015-10-05 DIAGNOSIS — M9901 Segmental and somatic dysfunction of cervical region: Secondary | ICD-10-CM

## 2015-10-05 DIAGNOSIS — M9902 Segmental and somatic dysfunction of thoracic region: Secondary | ICD-10-CM | POA: Diagnosis not present

## 2015-10-05 DIAGNOSIS — M9908 Segmental and somatic dysfunction of rib cage: Secondary | ICD-10-CM

## 2015-10-05 DIAGNOSIS — M999 Biomechanical lesion, unspecified: Secondary | ICD-10-CM

## 2015-10-05 DIAGNOSIS — M62838 Other muscle spasm: Secondary | ICD-10-CM | POA: Insufficient documentation

## 2015-10-05 MED ORDER — TIZANIDINE HCL 4 MG PO TABS: 4.0000 mg | ORAL_TABLET | Freq: Three times a day (TID) | ORAL | Status: DC | PRN

## 2015-10-05 NOTE — Assessment & Plan Note (Signed)
Decision today to treat with OMT was based on Physical Exam  After verbal consent patient was treated with HVLA, ME, FPR techniques in cervical, thoracic and rib areas  Patient tolerated the procedure well with improvement in symptoms  Patient given exercises, stretches and lifestyle modifications  See medications in patient instructions if given  Patient will follow up in 6 weeks              

## 2015-10-05 NOTE — Patient Instructions (Addendum)
Good to see you Kristie Fitzgerald is your friend Stay active and make sure caring for yourself.  Stay on good mattress  When you can attempt to put the monitor at eye level with the monitor.  Iron with vitamin C Vitamin D 2000 IU 3 times a week.  Zanaflex as needed See me again in 3 weeks.

## 2015-10-05 NOTE — Assessment & Plan Note (Signed)
Still the underlying cause the patient is having some cervical neck muscle tightness mostly of the scalenes. We discussed icing regimen and home exercises. We discussed which activities to help which wants to avoid. Patient given muscle relaxer as well. We discussed changing ergonomics at work if possible. Patient given a prescription for muscle relaxer as stated above. Patient will try these different changes and come back and see me again in 3-4 weeks. Continues to respond well to osteopathic manipulation.

## 2015-10-05 NOTE — Progress Notes (Signed)
[facility] [address], [place] [address] Phone: [phone] Subjective:      CC: Bilateral shoulder pain upper back pain follow up  [id number] Kristie Fitzgerald is a 25 y.o. female coming in with complaint of bilateral shoulder pain. Patient is found to have more of a muscle imbalances as well as weakness of the upper back.patient has been doing the exercises and was feeling better. Over the holidays as well as over the [place] Year's patient started having increasing discomfort of the upper back in the shoulders bilaterally. Patient called and we did discuss doing ibuprofen on a more regular basis. Patient states more tightness possible more on the left side.  Sometimes mild pain down the arm.  Very minimal with only certain movement not constant. Patient states that the B12 and does help. Not stopping her from daily activities makes it difficult. Patient did travel for the holidays. States that sitting on the different mattresses has become increasing her pain as well and probably exacerbate the problem.   Past Medical History  Diagnosis Date  . Frequent UTI   . Anxiety   . Chronic tonsillitis 06/2015  . Tonsillar calculus 06/2015  . Dental crown present   . Seasonal allergies   . ADD (attention deficit disorder)    Past Surgical History  Procedure Laterality Date  . Mass excision N/A 03/22/2013    Procedure: EXCISION MASS;  Surgeon: Rolm Bookbinder, MD;  Location: [facility];  Service: General;  Laterality: N/A;  . Wisdom tooth extraction    . Tonsillectomy N/A 07/03/2015    Procedure: TONSILLECTOMY;  Surgeon: Izora Gala, MD;  Location: [facility];  Service: ENT;  Laterality: N/A;   Social History  Substance Use Topics  . Smoking status: Never Smoker   . Smokeless tobacco: Never Used  . Alcohol Use: Yes     Comment: socially   Family History  Problem Relation Age of Onset  . Cancer Father     melanoma   No Known  Allergies      Past medical history, social, surgical and family history all reviewed in electronic medical record.   Review of Systems: No headache, visual changes, nausea, vomiting, diarrhea, constipation, dizziness, abdominal pain, skin rash, fevers, chills, night sweats, weight loss, swollen lymph nodes, body aches, joint swelling, muscle aches, chest pain, shortness of breath, mood changes.   Objective Blood pressure 122/82, pulse 69, SpO2 97 %.  General: No apparent distress alert and oriented x3 mood and affect normal, dressed appropriately.  HEENT: Pupils equal, extraocular movements intact  Respiratory: Patient's speak in full sentences and does not appear short of breath  Cardiovascular: No lower extremity edema, non tender, no erythema  Skin: Warm dry intact with no signs of infection or rash on extremities or on axial skeleton.  Abdomen: Soft nontender  Neuro: Cranial nerves II through XII are intact, neurovascularly intact in all extremities with 2+ DTRs and 2+ pulses.  Lymph: No lymphadenopathy of posterior or anterior cervical chain or axillae bilaterally.  Gait normal with good balance and coordination.  MSK:  Non tender with full range of motion and good stability and symmetric strength and tone of  elbows, wrist, hip, knee and ankles bilaterally.  Neck: Inspection unremarkable. Still poor posture overall. Negative Spurling's maneuver. Significant increase in tightness from previous exam on the left side. Grip strength and sensation normal in bilateral hands Strength good C4 to T1 distribution No sensory change to C4 to  T1 Negative Hoffman sign bilaterally Reflexes normal    Osteopathic findings C3 flexed rotated and side bent left C7 flexed rotated and side bent right T3 extended rotated and side bent left with inhaled third rib T7 extended rotated and side bent right L2 flexed rotated inside that right L4 flexed rotated and side bent left Sacrum left on  left      Impression and Recommendations:     This case required medical decision making of moderate complexity.

## 2015-10-18 ENCOUNTER — Ambulatory Visit: Payer: 59 | Admitting: Family Medicine

## 2015-10-26 ENCOUNTER — Ambulatory Visit: Payer: 59 | Admitting: Family Medicine

## 2016-01-12 ENCOUNTER — Encounter: Payer: Self-pay | Admitting: Adult Health

## 2016-01-12 ENCOUNTER — Ambulatory Visit (INDEPENDENT_AMBULATORY_CARE_PROVIDER_SITE_OTHER): Payer: BLUE CROSS/BLUE SHIELD | Admitting: Adult Health

## 2016-01-12 VITALS — BP 110/70 | Temp 98.6°F | Wt 196.3 lb

## 2016-01-12 DIAGNOSIS — M779 Enthesopathy, unspecified: Secondary | ICD-10-CM | POA: Diagnosis not present

## 2016-01-12 NOTE — Patient Instructions (Addendum)
It was great seeing you again.   Continue to wear the wrist splint.   Ice/Ibuprofen/Tylenol  Follow up with Sports Medicine.     Tendinitis Tendinitis is swelling and inflammation of the tendons. Tendons are band-like tissues that connect muscle to bone. Tendinitis commonly occurs in the:   Shoulders (rotator cuff).  Heels (Achilles tendon).  Elbows (triceps tendon). CAUSES Tendinitis is usually caused by overusing the tendon, muscles, and joints involved. When the tissue surrounding a tendon (synovium) becomes inflamed, it is called tenosynovitis. Tendinitis commonly develops in people whose jobs require repetitive motions. SYMPTOMS  Pain.  Tenderness.  Mild swelling. DIAGNOSIS Tendinitis is usually diagnosed by physical exam. Your health care provider may also order X-rays or other imaging tests. TREATMENT Your health care provider may recommend certain medicines or exercises for your treatment. HOME CARE INSTRUCTIONS   Use a sling or splint for as long as directed by your health care provider until the pain decreases.  Put ice on the injured area.  Put ice in a plastic bag.  Place a towel between your skin and the bag.  Leave the ice on for 15-20 minutes, 3-4 times a day, or as directed by your health care provider.  Avoid using the limb while the tendon is painful. Perform gentle range of motion exercises only as directed by your health care provider. Stop exercises if pain or discomfort increase, unless directed otherwise by your health care provider.  Only take over-the-counter or prescription medicines for pain, discomfort, or fever as directed by your health care provider. SEEK MEDICAL CARE IF:   Your pain and swelling increase.  You develop new, unexplained symptoms, especially increased numbness in the hands. MAKE SURE YOU:   Understand these instructions.  Will watch your condition.  Will get help right away if you are not doing well or get worse.   This information is not intended to replace advice given to you by your health care provider. Make sure you discuss any questions you have with your health care provider.   Document Released: 09/06/2000 Document Revised: 09/30/2014 Document Reviewed: 11/26/2010 Elsevier Interactive Patient Education [employer].

## 2016-01-12 NOTE — Progress Notes (Signed)
   Subjective:    Patient ID: Lenox Ahr, female    DOB: [birth year], 25 y.o.   MRN: [id number]  Wrist Pain  The pain is present in the left wrist. This is a new problem. The current episode started 1 to 4 weeks ago. There has been no history of extremity trauma. The problem occurs constantly. The problem has been unchanged. The quality of the pain is described as aching. Pertinent negatives include no inability to bear weight, joint locking, joint swelling, limited range of motion or numbness. The symptoms are aggravated by activity and heat. She has tried acetaminophen, cold and NSAIDS for the symptoms. The treatment provided mild relief. Family history does not include gout or rheumatoid arthritis. There is no history of diabetes, gout, osteoarthritis or rheumatoid arthritis.   Denies any numbness or tingling in her fingers. Have been using Ibuprofen 600mg  and ice - which helps with the pain.   Has been wearing a wrist splint she bought at [employer]  Review of Systems  Constitutional: Negative.   Musculoskeletal: Negative for joint swelling, arthralgias and gout.  Neurological: Negative for weakness and numbness.       Objective:   Physical Exam  Constitutional: She is oriented to person, place, and time. She appears well-developed and well-nourished. No distress.  Musculoskeletal: Normal range of motion. She exhibits no edema or tenderness.  Full ROM. No pain with palpation.  Negative Phalen and Tinel's  No bruising or trauma noted.  Has good radial pulses and cap refill   Neurological: She is alert and oriented to person, place, and time.  Skin: Skin is warm and dry. No rash noted. She is not diaphoretic. No erythema. No pallor.  Psychiatric: She has a normal mood and affect. Her behavior is normal. Judgment and thought content normal.  Nursing note and vitals reviewed.     Assessment & Plan:  1. Tendonitis - Wrist splint applied in office.  - Ibuprofen 600mg  Q8H x 3 days.  Can rotate tylenol as needed - Continue to ice - Has follow up appointment with Sports Medicine next week.  - Consider steroid injections.   Dorothyann Peng, NP

## 2016-01-17 ENCOUNTER — Encounter: Payer: Self-pay | Admitting: Family Medicine

## 2016-01-17 ENCOUNTER — Other Ambulatory Visit (INDEPENDENT_AMBULATORY_CARE_PROVIDER_SITE_OTHER): Payer: BLUE CROSS/BLUE SHIELD

## 2016-01-17 ENCOUNTER — Ambulatory Visit (INDEPENDENT_AMBULATORY_CARE_PROVIDER_SITE_OTHER): Payer: BLUE CROSS/BLUE SHIELD | Admitting: Family Medicine

## 2016-01-17 VITALS — BP 114/74 | HR 74 | Wt 195.0 lb

## 2016-01-17 DIAGNOSIS — M654 Radial styloid tenosynovitis [de Quervain]: Secondary | ICD-10-CM | POA: Diagnosis not present

## 2016-01-17 DIAGNOSIS — M9908 Segmental and somatic dysfunction of rib cage: Secondary | ICD-10-CM

## 2016-01-17 DIAGNOSIS — M9902 Segmental and somatic dysfunction of thoracic region: Secondary | ICD-10-CM

## 2016-01-17 DIAGNOSIS — M25531 Pain in right wrist: Secondary | ICD-10-CM

## 2016-01-17 DIAGNOSIS — M999 Biomechanical lesion, unspecified: Secondary | ICD-10-CM

## 2016-01-17 DIAGNOSIS — M9901 Segmental and somatic dysfunction of cervical region: Secondary | ICD-10-CM | POA: Diagnosis not present

## 2016-01-17 DIAGNOSIS — M899 Disorder of bone, unspecified: Secondary | ICD-10-CM | POA: Diagnosis not present

## 2016-01-17 MED ORDER — MELOXICAM 15 MG PO TABS: 15.0000 mg | ORAL_TABLET | Freq: Every day | ORAL | Status: DC

## 2016-01-17 NOTE — Assessment & Plan Note (Signed)
Patient does have some tenosynovitis. Mild to moderate in nature. We discussed bracing and patient was given a thumb spica splint. I think this will be more beneficial. We discussed icing as well as oral anti-inflammatories which were prescribed. Patient work with Product/process development scientist to learn home exercises in greater detail. Patient will follow-up with me again in 3-4 weeks. At that time if worsening symptoms we can consider injection as well as formal physical therapy.

## 2016-01-17 NOTE — Progress Notes (Signed)
[facility] [place], [place] [address] Phone: [phone] Subjective:      CC: upper back pain as well as right wrist pain  [id number] Kristie Fitzgerald is a 25 y.o. female coming in with complaint of bilateral shoulder pain. Patient is found to have more of a muscle imbalances as well as weakness of the upper back.overall has been doing relatively well. Patient continues to try to work out on a regular basis. Intermittently does the exercises we have given her. States his long as she wants her posture throughout the day she does relatively well.  Patient is coming in with a new problem. States that it seems to be more of a right wrist pain. Tunnel provider and was diagnosed with a tendinitis. Was given a brace but does not feel that it is making any benefit. Has not tried any anti-inflammatories. States that it can be a sharp pain. Sometimes a dull aching pain when she wakes up. Denies any weakness or numbness. Rates the severity is 7 out of 10 and potentially worsening.    Past Medical History  Diagnosis Date  . Frequent UTI   . Anxiety   . Chronic tonsillitis 06/2015  . Tonsillar calculus 06/2015  . Dental crown present   . Seasonal allergies   . ADD (attention deficit disorder)    Past Surgical History  Procedure Laterality Date  . Mass excision N/A 03/22/2013    Procedure: EXCISION MASS;  Surgeon: Rolm Bookbinder, MD;  Location: [facility];  Service: General;  Laterality: N/A;  . Wisdom tooth extraction    . Tonsillectomy N/A 07/03/2015    Procedure: TONSILLECTOMY;  Surgeon: Izora Gala, MD;  Location: [facility];  Service: ENT;  Laterality: N/A;   Social History  Substance Use Topics  . Smoking status: Never Smoker   . Smokeless tobacco: Never Used  . Alcohol Use: Yes     Comment: socially   Family History  Problem Relation Age of Onset  . Cancer Father     melanoma   No Known Allergies      Past  medical history, social, surgical and family history all reviewed in electronic medical record.   Review of Systems: No headache, visual changes, nausea, vomiting, diarrhea, constipation, dizziness, abdominal pain, skin rash, fevers, chills, night sweats, weight loss, swollen lymph nodes, body aches, joint swelling, muscle aches, chest pain, shortness of breath, mood changes.   Objective Blood pressure 114/74, pulse 74, weight 195 lb (88.451 kg).  General: No apparent distress alert and oriented x3 mood and affect normal, dressed appropriately.  HEENT: Pupils equal, extraocular movements intact  Respiratory: Patient's speak in full sentences and does not appear short of breath  Cardiovascular: No lower extremity edema, non tender, no erythema  Skin: Warm dry intact with no signs of infection or rash on extremities or on axial skeleton.  Abdomen: Soft nontender  Neuro: Cranial nerves II through XII are intact, neurovascularly intact in all extremities with 2+ DTRs and 2+ pulses.  Lymph: No lymphadenopathy of posterior or anterior cervical chain or axillae bilaterally.  Gait normal with good balance and coordination.  MSK:  Non tender with full range of motion and good stability and symmetric strength and tone of  elbows, wrist, hip, knee and ankles bilaterally.  Neck: Inspection unremarkable. Still poor posture overall. Negative Spurling's maneuver. Significant increase in tightness from previous exam on the left side. Grip strength and sensation normal in bilateral hands Strength  good C4 to T1 distribution No sensory change to C4 to T1 Negative Hoffman sign bilaterally Reflexes normal  Wrist:right Inspection normal with no visible erythema or swelling. ROM smooth and normal with good flexion and extension and ulnar/radial deviation that is symmetrical with opposite wrist. Palpation is normal over metacarpals, navicular, lunate, and TFCC; tendons without tenderness/ swelling No snuffbox  tenderness. No tenderness over Canal of Guyon. Strength 5/5 in all directions without pain. positiveFinkelstein, negative tinel's and phalens. Negative Watson's test. Contralateral wrist unremarkable   MSK US performed of: right wrist This study was ordered, performed, and interpreted by Charlann Boxer D.O.  Wrist: Abductor pollicis longus tendon sheath does have significant hypoechoic changes but no true tear appreciated. No effusion seen. TFCC intact. Scapholunate ligament intact. Carpal tunnel visualized and median nerve area normal, flexor tendons all normal in appearance without fraying, tears, or sheath effusions.   IMPRESSION:  De Quervain's tenosynovitis  Osteopathic findings C3 flexed rotated and side bent left C7 flexed rotated and side bent right T3 extended rotated and side bent left with inhaled third rib T7 extended rotated and side bent right L2 flexed rotated inside that right L4 flexed rotated and side bent left Sacrum left on left      Impression and Recommendations:     This case required medical decision making of moderate complexity.

## 2016-01-17 NOTE — Assessment & Plan Note (Signed)
Encourage patient to continue to do the exercises. We discussed icing regimen and home exercises. Once again discussed posture is going to be key. Encourage core strengthening. Patient will come back and see me again in 4-6 weeks for further evaluation and treatment.

## 2016-01-17 NOTE — Assessment & Plan Note (Signed)
Decision today to treat with OMT was based on Physical Exam  After verbal consent patient was treated with HVLA, ME, FPR techniques in cervical, thoracic and rib areas  Patient tolerated the procedure well with improvement in symptoms  Patient given exercises, stretches and lifestyle modifications  See medications in patient instructions if given  Patient will follow up in 6 weeks              

## 2016-01-17 NOTE — Patient Instructions (Signed)
Good to see yo u Kristie Fitzgerald is your friend  Stay active  Brace day and night for 1 weeks then nightly for 2 weeks.  Meloxicam daily for 10 days then as needed Exercises 3 times a week.  See me again in 4 weeks to make sure it is gone or we can try injection.

## 2016-01-17 NOTE — Progress Notes (Signed)
Pre visit review using our clinic review tool, if applicable. No additional management support is needed unless otherwise documented below in the visit note. 

## 2016-02-06 ENCOUNTER — Encounter: Payer: Self-pay | Admitting: Family Medicine

## 2016-02-06 ENCOUNTER — Ambulatory Visit (INDEPENDENT_AMBULATORY_CARE_PROVIDER_SITE_OTHER): Payer: BLUE CROSS/BLUE SHIELD | Admitting: Family Medicine

## 2016-02-06 VITALS — BP 122/82 | HR 82 | Ht 69.0 in | Wt 195.0 lb

## 2016-02-06 DIAGNOSIS — M25531 Pain in right wrist: Secondary | ICD-10-CM

## 2016-02-06 DIAGNOSIS — M9908 Segmental and somatic dysfunction of rib cage: Secondary | ICD-10-CM | POA: Diagnosis not present

## 2016-02-06 DIAGNOSIS — M6248 Contracture of muscle, other site: Secondary | ICD-10-CM

## 2016-02-06 DIAGNOSIS — M9901 Segmental and somatic dysfunction of cervical region: Secondary | ICD-10-CM | POA: Diagnosis not present

## 2016-02-06 DIAGNOSIS — M25532 Pain in left wrist: Secondary | ICD-10-CM

## 2016-02-06 DIAGNOSIS — M9902 Segmental and somatic dysfunction of thoracic region: Secondary | ICD-10-CM

## 2016-02-06 DIAGNOSIS — M999 Biomechanical lesion, unspecified: Secondary | ICD-10-CM

## 2016-02-06 DIAGNOSIS — M62838 Other muscle spasm: Secondary | ICD-10-CM

## 2016-02-06 MED ORDER — PREDNISONE 50 MG PO TABS: 50.0000 mg | ORAL_TABLET | Freq: Every day | ORAL | Status: DC

## 2016-02-06 NOTE — Assessment & Plan Note (Signed)
Patient continues to have some muscle spasm. 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 Does respond well to osteopathic manipulation. We'll continue see me in 6-8 week intervals.

## 2016-02-06 NOTE — Assessment & Plan Note (Signed)
Decision today to treat with OMT was based on Physical Exam  After verbal consent patient was treated with HVLA, ME, FPR techniques in cervical, thoracic and rib areas  Patient tolerated the procedure well with improvement in symptoms  Patient given exercises, stretches and lifestyle modifications  See medications in patient instructions if given  Patient will follow up in 6-8 weeks

## 2016-02-06 NOTE — Progress Notes (Signed)
Pre visit review using our clinic review tool, if applicable. No additional management support is needed unless otherwise documented below in the visit note. 

## 2016-02-06 NOTE — Patient Instructions (Addendum)
Good to see you  Prednisone daily for 5 days  After prednisone if you need anything else try duexis 3 times a day for 3 days. If you like we can get a prescription.  Watch the diet a little and see how you do.  We will hold on the labs for now.  If no better in a week call and we will try injection.

## 2016-02-06 NOTE — Progress Notes (Signed)
[name] [address], [place] [address] Phone: [phone] Subjective:      CC: bilateral wrist pain  [id number] Kristie Fitzgerald is a 25 y.o. female coming in with complaint bilateral wrist pain. Patient states that it is more of a dull throbbing aching sensation of both. Attempted to be trying to type on a regular basis for it has been having difficulty. When asked patient has had significant diet changes where she is eating a lot more seafood and has been to 2 weddings where she has drank a lot more alcohol. States that this seemed to make the wrist worse as well. Seem to be bad at rest or with repetitive motions. Has been out of work for the last week. Does not remember any true injury.  Patient also has chronic neck and upper back pain. Seems me for osteopathic manipulation. Having some stiffness recently because she has not been doing the exercises on a regular basis.  Past Medical History  Diagnosis Date  . Frequent UTI   . Anxiety   . Chronic tonsillitis 06/2015  . Tonsillar calculus 06/2015  . Dental crown present   . Seasonal allergies   . ADD (attention deficit disorder)    Past Surgical History  Procedure Laterality Date  . Mass excision N/A 03/22/2013    Procedure: EXCISION MASS;  Surgeon: Rolm Bookbinder, MD;  Location: [facility];  Service: General;  Laterality: N/A;  . Wisdom tooth extraction    . Tonsillectomy N/A 07/03/2015    Procedure: TONSILLECTOMY;  Surgeon: Izora Gala, MD;  Location: [facility];  Service: ENT;  Laterality: N/A;   Social History  Substance Use Topics  . Smoking status: Never Smoker   . Smokeless tobacco: Never Used  . Alcohol Use: Yes     Comment: socially   Family History  Problem Relation Age of Onset  . Cancer Father     melanoma   No Known Allergiesno known drug allergies      Past medical history, social, surgical and family history all reviewed in electronic  medical record.   Review of Systems: No headache, visual changes, nausea, vomiting, diarrhea, constipation, dizziness, abdominal pain, skin rash, fevers, chills, night sweats, weight loss, swollen lymph nodes, body aches, joint swelling, muscle aches, chest pain, shortness of breath, mood changes.   Objective Blood pressure 122/82, pulse 82, height 5\' 9"  (1.753 m), weight 195 lb (88.451 kg), SpO2 97 %.  General: No apparent distress alert and oriented x3 mood and affect normal, dressed appropriately.  HEENT: Pupils equal, extraocular movements intact  Respiratory: Patient's speak in full sentences and does not appear short of breath  Cardiovascular: No lower extremity edema, non tender, no erythema  Skin: Warm dry intact with no signs of infection or rash on extremities or on axial skeleton.  Abdomen: Soft nontender  Neuro: Cranial nerves II through XII are intact, neurovascularly intact in all extremities with 2+ DTRs and 2+ pulses.  Lymph: No lymphadenopathy of posterior or anterior cervical chain or axillae bilaterally.  Gait normal with good balance and coordination.  MSK:  Non tender with full range of motion and good stability and symmetric strength and tone of  elbows, wrist, hip, knee and ankles bilaterally.  Neck: Inspection unremarkable. Still poor posture overall. Negative Spurling's maneuver. Significant increase in tightness from previous exam on the left side. Grip strength and sensation normal in bilateral hands Strength good C4 to T1 distribution No sensory change to  C4 to T1 Negative Hoffman sign bilaterally Reflexes normal  Wristbilateral Mild erythema from previous exam ROM smooth and normal with good flexion and extension and ulnar/radial deviation that is symmetrical with opposite wrist. Palpation is normal over metacarpals, navicular, lunate, and TFCC; tendons without tenderness/ swelling No snuffbox tenderness. No tenderness over Canal of Guyon. Strength 5/5 in  all directions without pain. positiveFinkelstein, negative tinel's and phalens. Negative Watson's test.      Osteopathic findings C3 flexed rotated and side bent left C7 flexed rotated and side bent right T3 extended rotated and side bent left with inhaled third rib T7 extended rotated and side bent right L2 flexed rotated inside that right L4 flexed rotated and side bent left Sacrum left on left Same pattern as previously    Impression and Recommendations:     This case required medical decision making of moderate complexity.

## 2016-02-06 NOTE — Assessment & Plan Note (Signed)
Patient is having bilateral wrist pain and does seem to correspond with a de Quervain's tenosynovitis. Carpal tunnel was within the differential but no numbness or weakness noted. Ultrasound did not show any increase in diameter of the median nerve today. Questionable gout is within the differential. Patient given prednisone so we can avoid injections in an individual that she'll. Patient will take this daily for 5 days. Discussed anti-inflammatories and given a trial of duexis to see if she responds. Patient call in one week and if no significant improvement we'll consider injections as well as likely laboratory workup.

## 2016-02-12 ENCOUNTER — Other Ambulatory Visit (INDEPENDENT_AMBULATORY_CARE_PROVIDER_SITE_OTHER): Payer: BLUE CROSS/BLUE SHIELD

## 2016-02-12 ENCOUNTER — Telehealth: Payer: Self-pay | Admitting: Family Medicine

## 2016-02-12 ENCOUNTER — Other Ambulatory Visit: Payer: Self-pay

## 2016-02-12 DIAGNOSIS — M25532 Pain in left wrist: Secondary | ICD-10-CM

## 2016-02-12 DIAGNOSIS — M25531 Pain in right wrist: Secondary | ICD-10-CM | POA: Diagnosis not present

## 2016-02-12 LAB — URIC ACID: Uric Acid, Serum: 5.3 mg/dL (ref 2.4–7.0)

## 2016-02-12 NOTE — Telephone Encounter (Signed)
States she was told to call Dr. Tamala Julian back today to let him know how she was doing.  States she had some questions as well.

## 2016-02-12 NOTE — Telephone Encounter (Signed)
Left message for patient that labs would be ordered and can come to clinic to have them drawn in basement

## 2016-02-12 NOTE — Telephone Encounter (Signed)
Spoke with patient. Wondering if she should be tested for gout. Finished steroids on Saturday, has had increased symptoms in her wrists since Sunday pm. I told her I would call her back after speaking with Dr. Tamala Julian.

## 2016-02-13 LAB — ANA: Anti Nuclear Antibody(ANA): NEGATIVE

## 2016-02-14 ENCOUNTER — Telehealth: Payer: Self-pay | Admitting: Family Medicine

## 2016-02-14 ENCOUNTER — Ambulatory Visit: Payer: BLUE CROSS/BLUE SHIELD | Admitting: Family Medicine

## 2016-02-14 NOTE — Telephone Encounter (Signed)
Injection or PT

## 2016-02-14 NOTE — Telephone Encounter (Signed)
Left message to call back  

## 2016-02-14 NOTE — Telephone Encounter (Signed)
Pt called in and said that since her blood work was all negative, what is the next steps?

## 2016-02-14 NOTE — Telephone Encounter (Signed)
Patient returned call. Coming in 02/15/16 for injection

## 2016-02-15 ENCOUNTER — Ambulatory Visit (INDEPENDENT_AMBULATORY_CARE_PROVIDER_SITE_OTHER): Payer: BLUE CROSS/BLUE SHIELD | Admitting: Family Medicine

## 2016-02-15 ENCOUNTER — Encounter: Payer: Self-pay | Admitting: Family Medicine

## 2016-02-15 ENCOUNTER — Other Ambulatory Visit: Payer: Self-pay

## 2016-02-15 VITALS — BP 114/80 | HR 69

## 2016-02-15 DIAGNOSIS — M654 Radial styloid tenosynovitis [de Quervain]: Secondary | ICD-10-CM | POA: Insufficient documentation

## 2016-02-15 DIAGNOSIS — M25531 Pain in right wrist: Secondary | ICD-10-CM | POA: Diagnosis not present

## 2016-02-15 MED ORDER — TIZANIDINE HCL 4 MG PO TABS: 4.0000 mg | ORAL_TABLET | Freq: Three times a day (TID) | ORAL | Status: DC | PRN

## 2016-02-15 NOTE — Assessment & Plan Note (Signed)
Injected bilaterally today. We discussed icing regimen and home exercises. We discussed which activities to do in which ones to avoid. Patient will do bracing for short course. Has meloxicam for breakthrough pain. Refill muscle relaxer. If continuing him difficulty possible EMG would be necessary.

## 2016-02-15 NOTE — Progress Notes (Signed)
Pre visit review using our clinic review tool, if applicable. No additional management support is needed unless otherwise documented below in the visit note. 

## 2016-02-15 NOTE — Progress Notes (Signed)
[name] [address], [place] [address] Phone: [phone] Subjective:      CC: bilateral wrist pain  [id number] Kristie Fitzgerald is a 25 y.o. female coming in with complaint bilateral wrist pain. Patient was seen previously and was diagnosed with more of a bilateral de Quervain's tenosynovitis. Patient elected to try conservative therapy. Has not had any significant benefit. Continues to have pain. Patient was worked up for any type of autoimmune diseases. Labs were unremarkable. Patient recently has had a synovitis noted as well as some on calcific changes in her foot as stated. This is from an outside facility in per patient with no true imaging. Patient has failed all other conservative therapy.  Past Medical History  Diagnosis Date  . Frequent UTI   . Anxiety   . Chronic tonsillitis 06/2015  . Tonsillar calculus 06/2015  . Dental crown present   . Seasonal allergies   . ADD (attention deficit disorder)    Past Surgical History  Procedure Laterality Date  . Mass excision N/A 03/22/2013    Procedure: EXCISION MASS;  Surgeon: Rolm Bookbinder, MD;  Location: [facility];  Service: General;  Laterality: N/A;  . Wisdom tooth extraction    . Tonsillectomy N/A 07/03/2015    Procedure: TONSILLECTOMY;  Surgeon: Izora Gala, MD;  Location: [facility];  Service: ENT;  Laterality: N/A;   Social History  Substance Use Topics  . Smoking status: Never Smoker   . Smokeless tobacco: Never Used  . Alcohol Use: Yes     Comment: socially   Family History  Problem Relation Age of Onset  . Cancer Father     melanoma   No Known Allergiesno known drug allergies      Past medical history, social, surgical and family history all reviewed in electronic medical record.   Review of Systems: No headache, visual changes, nausea, vomiting, diarrhea, constipation, dizziness, abdominal pain, skin rash, fevers, chills, night sweats, weight  loss, swollen lymph nodes, body aches, joint swelling, muscle aches, chest pain, shortness of breath, mood changes.   Objective Blood pressure 114/80, pulse 69, SpO2 96 %.  General: No apparent distress alert and oriented x3 mood and affect normal, dressed appropriately.  HEENT: Pupils equal, extraocular movements intact  Respiratory: Patient's speak in full sentences and does not appear short of breath  Cardiovascular: No lower extremity edema, non tender, no erythema  Skin: Warm dry intact with no signs of infection or rash on extremities or on axial skeleton.  Abdomen: Soft nontender  Neuro: Cranial nerves II through XII are intact, neurovascularly intact in all extremities with 2+ DTRs and 2+ pulses.  Lymph: No lymphadenopathy of posterior or anterior cervical chain or axillae bilaterally.  Gait normal with good balance and coordination.  MSK:  Non tender with full range of motion and good stability and symmetric strength and tone of  elbows, wrist, hip, knee and ankles bilaterally.  l  Wristbilateral Mild erythema from previous exam ROM smooth and normal with good flexion and extension and ulnar/radial deviation that is symmetrical with opposite wrist. Palpation is normal over metacarpals, navicular, lunate, and TFCC; tendons without tenderness/ swelling No snuffbox tenderness. No tenderness over Canal of Guyon. Strength 5/5 in all directions without pain. positiveFinkelstein, negative tinel's and phalens. Negative Watson's test. No change from previous exam    Procedure: Real-time Ultrasound Guided Injection of right Abductor pollicis longs tendon sheath Device: GE Logiq E  Ultrasound guided injection is preferred  based studies that show increased duration, increased effect, greater accuracy, decreased procedural pain, increased response rate with ultrasound guided versus blind injection.  Verbal informed consent obtained.  Time-out conducted.  Noted no overlying erythema,  induration, or other signs of local infection.  Skin prepped in a sterile fashion.  Local anesthesia: Topical Ethyl chloride.  With sterile technique and under real time ultrasound guidance:  tendon visualized.  23g 5/8 inch needle inserted distal to proximal approach into tendon sheath. Pictures taken  for needle placement. Patient did have injection of 2 cc of 1% lidocaine, 1 cc of 0.5% Marcaine, and 0.5 cc of Kenalog 40 mg/dL. Completed without difficulty  Pain immediately resolved suggesting accurate placement of the medication.  Advised to call if fevers/chills, erythema, induration, drainage, or persistent bleeding.  Images permanently stored and available for review in the ultrasound unit.  Impression: Technically successful ultrasound guided injection.  Procedure: Real-time Ultrasound Guided Injection of left Abductor pollicis longs tendon sheath Device: GE Logiq E  Ultrasound guided injection is preferred based studies that show increased duration, increased effect, greater accuracy, decreased procedural pain, increased response rate with ultrasound guided versus blind injection.  Verbal informed consent obtained.  Time-out conducted.  Noted no overlying erythema, induration, or other signs of local infection.  Skin prepped in a sterile fashion.  Local anesthesia: Topical Ethyl chloride.  With sterile technique and under real time ultrasound guidance:  tendon visualized.  23g 5/8 inch needle inserted distal to proximal approach into tendon sheath. Pictures taken  for needle placement. Patient did have injection of 2 cc of 1% lidocaine, 1 cc of 0.5% Marcaine, and 0.5 cc of Kenalog 40 mg/dL. Completed without difficulty  Pain immediately resolved suggesting accurate placement of the medication.  Advised to call if fevers/chills, erythema, induration, drainage, or persistent bleeding.  Images permanently stored and available for review in the ultrasound unit.  Impression: Technically  successful ultrasound guided injection.      Impression and Recommendations:     This case required medical decision making of moderate complexity.

## 2016-02-15 NOTE — Patient Instructions (Signed)
Good to see you  Ice is your friend  Especially in 6 hours.  I hope this does the trick Meloxicam for next 4 days Brace at night for next week.  See me again in 2-3 weeks.

## 2016-02-16 ENCOUNTER — Other Ambulatory Visit: Payer: Self-pay | Admitting: Family Medicine

## 2016-02-26 ENCOUNTER — Encounter: Payer: Self-pay | Admitting: Family Medicine

## 2016-03-07 ENCOUNTER — Encounter: Payer: Self-pay | Admitting: Family Medicine

## 2016-03-07 ENCOUNTER — Ambulatory Visit (INDEPENDENT_AMBULATORY_CARE_PROVIDER_SITE_OTHER): Payer: BLUE CROSS/BLUE SHIELD | Admitting: Family Medicine

## 2016-03-07 VITALS — BP 116/76 | HR 64 | Wt 196.0 lb

## 2016-03-07 DIAGNOSIS — M9902 Segmental and somatic dysfunction of thoracic region: Secondary | ICD-10-CM | POA: Diagnosis not present

## 2016-03-07 DIAGNOSIS — M999 Biomechanical lesion, unspecified: Secondary | ICD-10-CM

## 2016-03-07 DIAGNOSIS — M2550 Pain in unspecified joint: Secondary | ICD-10-CM | POA: Diagnosis not present

## 2016-03-07 DIAGNOSIS — M9901 Segmental and somatic dysfunction of cervical region: Secondary | ICD-10-CM

## 2016-03-07 DIAGNOSIS — M899 Disorder of bone, unspecified: Secondary | ICD-10-CM | POA: Diagnosis not present

## 2016-03-07 DIAGNOSIS — M9908 Segmental and somatic dysfunction of rib cage: Secondary | ICD-10-CM

## 2016-03-07 MED ORDER — PREDNISONE 50 MG PO TABS: 50.0000 mg | ORAL_TABLET | Freq: Every day | ORAL | Status: DC

## 2016-03-07 NOTE — Progress Notes (Signed)
[facility] [address], [place] [address] Phone: [phone] Subjective:     CC: bilateral wrist pain f/u   [id number] Kristie Fitzgerald is a 25 y.o. female coming in with complaint bilateral wrist pain. Patient was seen previously and was diagnosed with more of a bilateral de Quervain's tenosynovitis. Patient elected to try conservative therapy. Was given injections.  States 60-75% better  Continues to have some pain and worse with activity such as typing. Also having intermittent swelling in feet.  Been work up for auto immune which was all normal but strong family history of Lupus. Only complete resolution was on prednisone.    Back is tight but not severe, not doing exercises regularly but when she does she feels better. Better with more activity No new symptoms.   Past Medical History  Diagnosis Date  . Frequent UTI   . Anxiety   . Chronic tonsillitis 06/2015  . Tonsillar calculus 06/2015  . Dental crown present   . Seasonal allergies   . ADD (attention deficit disorder)    Past Surgical History  Procedure Laterality Date  . Mass excision N/A 03/22/2013    Procedure: EXCISION MASS;  Surgeon: Rolm Bookbinder, MD;  Location: [facility];  Service: General;  Laterality: N/A;  . Wisdom tooth extraction    . Tonsillectomy N/A 07/03/2015    Procedure: TONSILLECTOMY;  Surgeon: Izora Gala, MD;  Location: [facility];  Service: ENT;  Laterality: N/A;   Social History  Substance Use Topics  . Smoking status: Never Smoker   . Smokeless tobacco: Never Used  . Alcohol Use: Yes     Comment: socially   Family History  Problem Relation Age of Onset  . Cancer Father     melanoma  Aunt with lupus.  No Known Allergiesno known drug allergies      Past medical history, social, surgical and family history all reviewed in electronic medical record.   Review of Systems: No headache, visual changes, nausea, vomiting, diarrhea,  constipation, dizziness, abdominal pain, skin rash, fevers, chills, night sweats, weight loss, swollen lymph nodes chest pain, shortness of breath, mood changes. ,Positive for body aches, joint swelling, muscle aches,  Objective Blood pressure 116/76, pulse 64, weight 196 lb (88.905 kg).  General: No apparent distress alert and oriented x3 mood and affect normal, dressed appropriately.  HEENT: Pupils equal, extraocular movements intact  Respiratory: Patient's speak in full sentences and does not appear short of breath  Cardiovascular: No lower extremity edema, non tender, no erythema  Skin: Warm dry intact with no signs of infection or rash on extremities or on axial skeleton.  Abdomen: Soft nontender  Neuro: Cranial nerves II through XII are intact, neurovascularly intact in all extremities with 2+ DTRs and 2+ pulses.  Lymph: No lymphadenopathy of posterior or anterior cervical chain or axillae bilaterally.  Gait normal with good balance and coordination.  MSK:  Non tender with full range of motion and good stability and symmetric strength and tone of  elbows, wrist, hip, knee and ankles bilaterally.   Neck: Inspection unremarkable. Still poor posture overall. Negative Spurling's maneuver. Significant increase in tightness from previous exam on the left side. Grip strength and sensation normal in bilateral hands Strength good C4 to T1 distribution No sensory change to C4 to T1 Negative Hoffman sign bilaterally Reflexes normal  Wrist bilateral No erythema no swelling  ROM smooth and normal with good flexion and extension and ulnar/radial deviation that is  symmetrical with opposite wrist. Palpation is normal over metacarpals, navicular, lunate, and TFCC; tendons without tenderness/ swelling No snuffbox tenderness. No tenderness over Canal of Guyon. Strength 5/5 in all directions without pain. Negative finklesteins negative tinel's and phalens. Negative Watson's test. Significant  improvement.    Osteopathic findings C2 flexed rotated and side bent left T3 extended rotated and side bent left with inhaled third rib T7 extended rotated and side bent right L2 flexed rotated inside that right Sacrum left on left     Impression and Recommendations:     This case required medical decision making of moderate complexity.

## 2016-03-07 NOTE — Patient Instructions (Signed)
Good to see you Try to do exercises 3 times  A week  Prednisone for another 5 days The start turmeric 500mg  twice daily.  I really hope this will help See me again for your back in 2-3 months Write me if you change your mind on PT

## 2016-03-07 NOTE — Assessment & Plan Note (Signed)
Discussed at length, ?seronegative RA Symtpoms are consistent. Intermittent swelling and bilateral joint pain.  DDx includes late onset lymphedema precox. 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  Wants to avoid drugs if possible.  Dis respond to prednsone and will treat short course.  Dissed turmeric and NSAIDs as first line treatment.  RTC in 4 weeks.

## 2016-03-07 NOTE — Assessment & Plan Note (Signed)
Continue to work on posture, discussed ergonomics, possible change in environment may work.  Discussed avoiding certain activities.  RTC in 4 weeks.  Does respond to OMT.

## 2016-03-07 NOTE — Assessment & Plan Note (Signed)
Decision today to treat with OMT was based on Physical Exam  After verbal consent patient was treated with HVLA, ME, FPR techniques in cervical, thoracic and rib areas  Patient tolerated the procedure well with improvement in symptoms  Patient given exercises, stretches and lifestyle modifications  See medications in patient instructions if given  Patient will follow up in 4 weeks            

## 2016-03-11 DIAGNOSIS — F411 Generalized anxiety disorder: Secondary | ICD-10-CM | POA: Diagnosis not present

## 2016-03-12 ENCOUNTER — Encounter: Payer: Self-pay | Admitting: Family Medicine

## 2016-03-12 ENCOUNTER — Encounter: Payer: Self-pay | Admitting: *Deleted

## 2016-03-12 ENCOUNTER — Ambulatory Visit (INDEPENDENT_AMBULATORY_CARE_PROVIDER_SITE_OTHER): Payer: BLUE CROSS/BLUE SHIELD | Admitting: Family Medicine

## 2016-03-12 VITALS — BP 134/94 | HR 86 | Temp 98.7°F | Ht 69.0 in | Wt 196.0 lb

## 2016-03-12 DIAGNOSIS — R109 Unspecified abdominal pain: Secondary | ICD-10-CM

## 2016-03-12 LAB — POCT URINE PREGNANCY: Preg Test, Ur: NEGATIVE

## 2016-03-12 MED ORDER — HYDROCODONE-ACETAMINOPHEN 5-325 MG PO TABS: 1.0000 | ORAL_TABLET | Freq: Four times a day (QID) | ORAL | Status: DC | PRN

## 2016-03-12 MED ORDER — ONDANSETRON 4 MG PO TBDP: 4.0000 mg | ORAL_TABLET | Freq: Three times a day (TID) | ORAL | Status: DC | PRN

## 2016-03-12 MED ORDER — TAMSULOSIN HCL 0.4 MG PO CAPS: 0.4000 mg | ORAL_CAPSULE | Freq: Every day | ORAL | Status: DC

## 2016-03-12 NOTE — Patient Instructions (Signed)
We will get a urine culture, also rule out pregnancy  We will call you about your referral for CT scan.  zofran for nausea  Schedule ibuprofen 400mg  every 6 hours.   Norco/vicodin as needed for pain on top of that  Flomax to help pass stone (wait to confirm stone is there on CT). Also can refer to urology if stone shows up on CT

## 2016-03-12 NOTE — Progress Notes (Signed)
Subjective:  Kristie Fitzgerald is a 25 y.o. year old very pleasant female patient who presents for/with See problem oriented charting ROS- see any ROS included in HPI as well.   Past Medical History-  Patient Active Problem List   Diagnosis Date Noted  . Polyarthralgia 03/07/2016  . De Quervain's tenosynovitis, bilateral 02/15/2016  . Bilateral wrist pain 02/06/2016  . De Quervain's tenosynovitis, right 01/17/2016  . Muscle spasms of neck 10/05/2015  . Scapular dysfunction 05/02/2015  . Nonallopathic lesion of cervical region 05/02/2015  . Nonallopathic lesion of thoracic region 05/02/2015  . Nonallopathic lesion-rib cage 05/02/2015  . Lipoma of back 02/23/2013  . UTI (lower urinary tract infection) 08/18/2012  . Fibrocystic breast disease 06/02/2012  . Routine general medical examination at a health care facility 06/02/2012  . Dysplastic nevus of upper extremity 03/11/2012  . Depression 03/10/2012  . ASCUS PAP 09/28/2010  . Dysfunctional uterine bleeding 03/30/2010    Medications- reviewed and updated Current Outpatient Prescriptions  Medication Sig Dispense Refill  . cholecalciferol (VITAMIN D) 1000 UNITS tablet Take 1,000 Units by mouth every other day.     . citalopram (CELEXA) 20 MG tablet Take 1 tablet (20 mg total) by mouth at bedtime. (Patient taking differently: Take 30 mg by mouth at bedtime. ) 100 tablet 3  . FIBER SELECT GUMMIES PO Take by mouth.    . predniSONE (DELTASONE) 50 MG tablet Take 1 tablet (50 mg total) by mouth daily. 5 tablet 0  . tiZANidine (ZANAFLEX) 4 MG tablet Take 1 tablet (4 mg total) by mouth every 8 (eight) hours as needed for muscle spasms. 30 tablet 2  . vitamin B-12 (CYANOCOBALAMIN) 100 MCG tablet Take 100 mcg by mouth daily. Reported on 01/12/2016    . zolpidem (AMBIEN) 5 MG tablet Take 5 mg by mouth at bedtime as needed for sleep.    . cloNIDine (CATAPRES) 0.2 MG tablet Take 0.2 mg by mouth at bedtime. Reported on 03/12/2016    . CRYSELLE-28  0.3-30 MG-MCG tablet TAKE ONE TABLET BY MOUTH ONE TIME DAILY (Patient not taking: Reported on 03/12/2016) 84 tablet 1   Objective: BP 134/94 mmHg  Pulse 86  Temp(Src) 98.7 F (37.1 C) (Oral)  Ht 5\' 9"  (1.753 m)  Wt 196 lb (88.905 kg)  BMI 28.93 kg/m2  SpO2 97% Gen: NAD, resting comfortably Unless back is tapped on. CV: RRR no murmurs rubs or gallops Lungs: CTAB no crackles, wheeze, rhonchi Abdomen: soft/nontender/nondistended/normal bowel sounds. No rebound or guarding.  Patient very tender on exam to tapping in right CVA area. Patient shifts her body away if hand is near right CVA area as well Ext: no edema Skin: warm, dry Neuro: grossly normal, moves all extremities  Assessment/Plan:  Right flank pain-high concern nephrolithiasis  S: History of UTI frequently. Usually has to take antibiotics prophylactically after sex and no UTI since doing this. Trialed off in past but had recurrence off.   3 weeks started with some frequency after sex. Took macrobid after the sex but still started. 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 Vomited about a week ago x1. Of note patient uses regular birth control pills as well as condoms. She was sexually active over month ago but has had a period since that time her cycle is regular every 30 days. Urine pregnancy negative today.  Started with middle back pain to right side this morning. Pain has led her to tears at times. Also  having nausea. Pain up to 4-7/10.  Pain comes and goes- not worse with peeing. Denies dysuria. [facility] she had a urine drawn which showed moderate blood on dipstick. Follow-up microscopic exam showed 10-20 RBCs. She is well-hydrated. The rest of her urinalysis was completely normal.  ROS- no fevers or chills. Having nausea , no rash over back. No polydipsia A/P: I have high concern for nephrolithiasis on the right side. Urine culture ordered but I  doubt UTI or pyelonephritis. Urine pregnancy negative. Father does have a history of nephrolithiasis. We willSchedule ibuprofen 400mg  every 6 hours with Norco/vicodin as needed for pain on top of that. Provide Zofran for nausea. We'll try to get CT tomorrow. Flomax to help pass stone (wait to confirm stone is there on CT). Also can refer to urology if stone shows up on CT. Push hydration, can strain urine   Orders Placed This Encounter  Procedures  . Urine culture    [facility]  . CT RENAL STONE STUDY    Standing Status: Future     Number of Occurrences:      Standing Expiration Date: 06/12/2017    Scheduling Instructions:     Wednesday please    Order Specific Question:  Reason for Exam (SYMPTOM  OR DIAGNOSIS REQUIRED)    Answer:  right flank pain, 10-20 RBC on microscopic urine    Order Specific Question:  Is the patient pregnant?    Answer:  No    Order Specific Question:  Preferred imaging location?    Answer:  [facility]  . POCT urine pregnancy    Meds ordered this encounter  Medications  . zolpidem (AMBIEN) 5 MG tablet    Sig: Take 5 mg by mouth at bedtime as needed for sleep.  Marland Kitchen ondansetron (ZOFRAN-ODT) 4 MG disintegrating tablet    Sig: Take 1 tablet (4 mg total) by mouth every 8 (eight) hours as needed for nausea or vomiting.    Dispense:  20 tablet    Refill:  0  . HYDROcodone-acetaminophen (NORCO/VICODIN) 5-325 MG tablet    Sig: Take 1 tablet by mouth every 6 (six) hours as needed for severe pain.    Dispense:  20 tablet    Refill:  0  . tamsulosin (FLOMAX) 0.4 MG CAPS capsule    Sig: Take 1 capsule (0.4 mg total) by mouth daily.    Dispense:  30 capsule    Refill:  0    Return precautions advised.  Garret Reddish, MD

## 2016-03-14 ENCOUNTER — Telehealth: Payer: Self-pay | Admitting: Family Medicine

## 2016-03-14 DIAGNOSIS — R109 Unspecified abdominal pain: Secondary | ICD-10-CM

## 2016-03-14 LAB — URINE CULTURE

## 2016-03-14 NOTE — Telephone Encounter (Signed)
Spoke with patient about urine results. 50k colonies- not clear UTI. COuld not get stat CT scan until Monday. I entered stat urology referral to see if we can get patient in tomorrow as they have CT scanner in office.   Roselyn Reef- can you go talk to Neoma Laming? Lets see if we can make this happen

## 2016-03-14 NOTE — Telephone Encounter (Signed)
Kristie Fitzgerald continue get me a status update from patient using her cell phone number? Has she been called by [facility]-we may need to call to see if we can get her in

## 2016-03-15 ENCOUNTER — Encounter: Payer: Self-pay | Admitting: Family Medicine

## 2016-03-15 DIAGNOSIS — N201 Calculus of ureter: Secondary | ICD-10-CM | POA: Diagnosis not present

## 2016-03-15 DIAGNOSIS — R109 Unspecified abdominal pain: Secondary | ICD-10-CM | POA: Diagnosis not present

## 2016-03-15 DIAGNOSIS — R350 Frequency of micturition: Secondary | ICD-10-CM | POA: Diagnosis not present

## 2016-03-15 NOTE — Telephone Encounter (Signed)
Has visit today with [facility]- Roselyn Reef updated me

## 2016-03-17 ENCOUNTER — Encounter: Payer: Self-pay | Admitting: Family Medicine

## 2016-03-18 ENCOUNTER — Other Ambulatory Visit: Payer: BLUE CROSS/BLUE SHIELD

## 2016-03-18 NOTE — Telephone Encounter (Signed)
Pt would like jaime to return her call

## 2016-03-22 ENCOUNTER — Encounter: Payer: Self-pay | Admitting: Family Medicine

## 2016-03-23 NOTE — Telephone Encounter (Signed)
Looking at your scan- it did appear you had a fair volume of stool. Are you having regular bowel movements? Are they firm? If firm or not at least once a day- I want you to take miralax a full capful (buy OTC) for at least a week and only hold for very loose stools. See if this helps your pain. I dont think a CT with contrast would give [place] much more additional information at present but if you have new or worsening symptoms I would like to reevaluate you or have you seek care at that time  If you are really regular and no firmness- I would go see Dr. Tamala Julian of sports medicine to see if he thinks there could be a muscular or skeletal cause of pain.   Thanks, Garret Reddish

## 2016-03-29 ENCOUNTER — Other Ambulatory Visit (HOSPITAL_COMMUNITY)
Admission: RE | Admit: 2016-03-29 | Discharge: 2016-03-29 | Disposition: A | Discharge status: Home / Self Care | Payer: BLUE CROSS/BLUE SHIELD | Source: Ambulatory Visit | Attending: Family Medicine | Admitting: Family Medicine

## 2016-03-29 ENCOUNTER — Encounter: Payer: Self-pay | Admitting: Family Medicine

## 2016-03-29 ENCOUNTER — Ambulatory Visit (INDEPENDENT_AMBULATORY_CARE_PROVIDER_SITE_OTHER): Payer: BLUE CROSS/BLUE SHIELD | Admitting: Family Medicine

## 2016-03-29 VITALS — BP 124/88 | HR 98 | Temp 98.7°F | Ht 69.0 in | Wt 200.0 lb

## 2016-03-29 DIAGNOSIS — R1031 Right lower quadrant pain: Secondary | ICD-10-CM | POA: Diagnosis not present

## 2016-03-29 DIAGNOSIS — Z113 Encounter for screening for infections with a predominantly sexual mode of transmission: Secondary | ICD-10-CM | POA: Diagnosis not present

## 2016-03-29 DIAGNOSIS — R319 Hematuria, unspecified: Secondary | ICD-10-CM

## 2016-03-29 DIAGNOSIS — N760 Acute vaginitis: Secondary | ICD-10-CM | POA: Insufficient documentation

## 2016-03-29 DIAGNOSIS — N898 Other specified noninflammatory disorders of vagina: Secondary | ICD-10-CM | POA: Diagnosis not present

## 2016-03-29 LAB — COMPREHENSIVE METABOLIC PANEL
ALT: 20 U/L (ref 0–35)
AST: 20 U/L (ref 0–37)
Albumin: 4.2 g/dL (ref 3.5–5.2)
Alkaline Phosphatase: 71 U/L (ref 39–117)
BUN: 8 mg/dL (ref 6–23)
CHLORIDE: 104 meq/L (ref 96–112)
CO2: 28 meq/L (ref 19–32)
CREATININE: 0.63 mg/dL (ref 0.40–1.20)
Calcium: 9.7 mg/dL (ref 8.4–10.5)
GFR: 121.11 mL/min (ref >60.00)
Glucose, Bld: 75 mg/dL (ref 70–99)
Potassium: 4.3 mEq/L (ref 3.5–5.1)
SODIUM: 139 meq/L (ref 135–145)
Total Bilirubin: 0.3 mg/dL (ref 0.2–1.2)
Total Protein: 6.8 g/dL (ref 6.0–8.3)

## 2016-03-29 LAB — CBC WITH DIFFERENTIAL/PLATELET
BASOS PCT: 0.6 % (ref 0.0–3.0)
Basophils Absolute: 0.1 10*3/uL (ref 0.0–0.1)
EOS PCT: 1.2 % (ref 0.0–5.0)
Eosinophils Absolute: 0.1 10*3/uL (ref 0.0–0.7)
HCT: 38.2 % (ref 36.0–46.0)
Hemoglobin: 13 g/dL (ref 12.0–15.0)
LYMPHS PCT: 25 % (ref 12.0–46.0)
Lymphs Abs: 2.3 10*3/uL (ref 0.7–4.0)
MCHC: 34 g/dL (ref 30.0–36.0)
MCV: 88.8 fl (ref 78.0–100.0)
MONOS PCT: 6.4 % (ref 3.0–12.0)
Monocytes Absolute: 0.6 10*3/uL (ref 0.1–1.0)
NEUTROS ABS: 6.2 10*3/uL (ref 1.4–7.7)
NEUTROS PCT: 66.8 % (ref 43.0–77.0)
PLATELETS: 275 10*3/uL (ref 150.0–400.0)
RBC: 4.3 Mil/uL (ref 3.87–5.11)
RDW: 13.7 % (ref 11.5–15.5)
WBC: 9.3 10*3/uL (ref 4.0–10.5)

## 2016-03-29 LAB — URINALYSIS, MICROSCOPIC ONLY: WBC UA: NONE SEEN (ref 0–?)

## 2016-03-29 LAB — POC URINALSYSI DIPSTICK (AUTOMATED)
Bilirubin, UA: NEGATIVE
GLUCOSE UA: NEGATIVE
KETONES UA: NEGATIVE
LEUKOCYTES UA: NEGATIVE
Nitrite, UA: NEGATIVE
Protein, UA: NEGATIVE
SPEC GRAV UA: 1.025
Urobilinogen, UA: 0.2
pH, UA: 7

## 2016-03-29 NOTE — Progress Notes (Signed)
Pre visit review using our clinic review tool, if applicable. No additional management support is needed unless otherwise documented below in the visit note. 

## 2016-03-29 NOTE — Progress Notes (Signed)
Subjective:  Kristie Fitzgerald is a 25 y.o. year old very pleasant female patient who presents for/with See problem oriented charting ROS- no fever, chills, nausea, vomiting. Has had some constipation. RLQ pain not better after BM..see any ROS included in HPI as well.   Past Medical History-  Patient Active Problem List   Diagnosis Date Noted  . Polyarthralgia 03/07/2016  . De Quervain's tenosynovitis, bilateral 02/15/2016  . Bilateral wrist pain 02/06/2016  . De Quervain's tenosynovitis, right 01/17/2016  . Muscle spasms of neck 10/05/2015  . Scapular dysfunction 05/02/2015  . Nonallopathic lesion of cervical region 05/02/2015  . Nonallopathic lesion of thoracic region 05/02/2015  . Nonallopathic lesion-rib cage 05/02/2015  . Lipoma of back 02/23/2013  . UTI (lower urinary tract infection) 08/18/2012  . Fibrocystic breast disease 06/02/2012  . Routine general medical examination at a health care facility 06/02/2012  . Dysplastic nevus of upper extremity 03/11/2012  . Depression 03/10/2012  . ASCUS PAP 09/28/2010  . Dysfunctional uterine bleeding 03/30/2010    Medications- reviewed and updated Current Outpatient Prescriptions  Medication Sig Dispense Refill  . cholecalciferol (VITAMIN D) 1000 UNITS tablet Take 1,000 Units by mouth every other day.     . citalopram (CELEXA) 20 MG tablet Take 1 tablet (20 mg total) by mouth at bedtime. (Patient taking differently: Take 30 mg by mouth at bedtime. ) 100 tablet 3  . CRYSELLE-28 0.3-30 MG-MCG tablet TAKE ONE TABLET BY MOUTH ONE TIME DAILY 84 tablet 1  . FIBER SELECT GUMMIES PO Take by mouth.    . ondansetron (ZOFRAN-ODT) 4 MG disintegrating tablet Take 1 tablet (4 mg total) by mouth every 8 (eight) hours as needed for nausea or vomiting. 20 tablet 0  . tiZANidine (ZANAFLEX) 4 MG tablet Take 1 tablet (4 mg total) by mouth every 8 (eight) hours as needed for muscle spasms. 30 tablet 2  . zolpidem (AMBIEN) 5 MG tablet Take 5 mg by mouth at  bedtime as needed for sleep.     No current facility-administered medications for this visit.    Objective: BP 124/88 mmHg  Pulse 98  Temp(Src) 98.7 F (37.1 C) (Oral)  Ht 5\' 9"  (1.753 m)  Wt 200 lb (90.719 kg)  BMI 29.52 kg/m2  SpO2 97% Gen: NAD, resting comfortably CV: RRR no murmurs rubs or gallops Lungs: CTAB no crackles, wheeze, rhonchi Abdomen: soft/moderate tenderness RLQ/nondistended/normal bowel sounds. No rebound or guarding.  Back with pain on palpation paraspinous muscles to right of spine. No CVA tenderness Ext: no edema Skin: warm, dry Neuro: grossly normal, moves all extremities Pelvic: cervix normal in appearance, external genitalia normal, no adnexal masses or tenderness, no cervical motion tenderness, uterus normal size, shape, and consistency and whitish vaginal discharge noted  Assessment/Plan:  Right flank pain resolved. Now has some lower back pain just to the right of spine.  RLQ pain new S: Pain was in CVA area previously has now shifted lower into lower but more towards middle of right back. Intermittent pain up to 7/10. Comes and goes- at least 2x a day if not 3-4. Lasts 20 minutes up to a few hours.   For about 5 days-Also has a different pain in RLQ that comes and goes with intensity but lingers more than the back pain pain issue . Personal trainer stretched her out and did not help. States always has vaginal discharge- no pain. Sexually active but uses condoms. LMP since saw me last and has finished at this point. Vaginal cramping x1  after laxative.   Was treated by urology for UTI initially but later stopped when culture was negative. Miralax helping constipation- bowel movements actually make pain worse.   A/P: Unclear etiology for RLQ pain (suspect back pain is MSK- also for flank pain suspicious may have passed kidney stone)- doubt appendicitis will complete STD testing, [place] vaginal, cbc, cmp, urine culture. CBC came back without elevated WBC so will  hold off on CT for now and use less radiation with [place] vagina. She continues to have hematuria microscopic and I have advised follow up with urology within a month  Orders Placed This Encounter  Procedures  . Urine culture    [facility]  . US Transvaginal Non-OB    Standing Status: Future     Number of Occurrences:      Standing Expiration Date: 05/30/2017    Order Specific Question:  Reason for Exam (SYMPTOM  OR DIAGNOSIS REQUIRED)    Answer:  RLQ pain- recent CT for kidney stones so trying to avoid if possible. ordering CBC and may have to do CT if  WBC elevation    Order Specific Question:  Preferred imaging location?    Answer:  [facility]  . CBC with Differential/Platelet  . Comprehensive metabolic panel    [place]  . Urine Microscopic  . POCT Urinalysis Dipstick (Automated)   The duration of face-to-face time during this visit was 25 minutes. Greater than 50% of this time was spent in counseling, explanation of diagnosis, planning of further management, and/or coordination of care.    Return precautions advised.  Garret Reddish, MD

## 2016-03-29 NOTE — Patient Instructions (Signed)
We will call you within a week about your referral for ultrasound. If you do not hear within a week, give [place] a call.   Labs before you leave including urine and urine culture again  Seek care if new or worsening symptoms or follow up with [place] if during the week and can get in quickly  Depending on labs- may have to get CT scan AGAIN since symptoms are different/new

## 2016-03-31 LAB — URINE CULTURE
Colony Count: NO GROWTH
Organism ID, Bacteria: NO GROWTH

## 2016-04-01 ENCOUNTER — Other Ambulatory Visit: Payer: Self-pay

## 2016-04-01 DIAGNOSIS — R1031 Right lower quadrant pain: Secondary | ICD-10-CM

## 2016-04-01 LAB — CERVICOVAGINAL ANCILLARY ONLY
CHLAMYDIA, DNA PROBE: NEGATIVE
Neisseria Gonorrhea: NEGATIVE
Trichomonas: NEGATIVE

## 2016-04-03 ENCOUNTER — Ambulatory Visit
Admission: RE | Admit: 2016-04-03 | Discharge: 2016-04-03 | Disposition: A | Discharge status: Home / Self Care | Payer: BLUE CROSS/BLUE SHIELD | Source: Ambulatory Visit | Attending: Family Medicine | Admitting: Family Medicine

## 2016-04-03 DIAGNOSIS — R1031 Right lower quadrant pain: Secondary | ICD-10-CM | POA: Diagnosis not present

## 2016-04-03 LAB — CERVICOVAGINAL ANCILLARY ONLY: CANDIDA VAGINITIS: NEGATIVE

## 2016-04-04 ENCOUNTER — Encounter: Payer: Self-pay | Admitting: Family Medicine

## 2016-04-04 ENCOUNTER — Other Ambulatory Visit: Payer: Self-pay | Admitting: Family Medicine

## 2016-04-04 MED ORDER — METRONIDAZOLE 500 MG PO TABS: 500.0000 mg | ORAL_TABLET | Freq: Two times a day (BID) | ORAL | Status: DC

## 2016-04-05 ENCOUNTER — Other Ambulatory Visit: Payer: Self-pay

## 2016-04-05 DIAGNOSIS — R1031 Right lower quadrant pain: Secondary | ICD-10-CM

## 2016-04-05 NOTE — Telephone Encounter (Signed)
My place referral as patient requested under RLQ pain.

## 2016-04-08 ENCOUNTER — Encounter: Payer: Self-pay | Admitting: Family Medicine

## 2016-04-09 MED ORDER — GABAPENTIN 100 MG PO CAPS: 200.0000 mg | ORAL_CAPSULE | Freq: Every day | ORAL | Status: DC

## 2016-04-15 DIAGNOSIS — R1031 Right lower quadrant pain: Secondary | ICD-10-CM | POA: Diagnosis not present

## 2016-04-16 ENCOUNTER — Other Ambulatory Visit: Payer: Self-pay

## 2016-04-16 DIAGNOSIS — M25532 Pain in left wrist: Principal | ICD-10-CM

## 2016-04-16 DIAGNOSIS — M25531 Pain in right wrist: Secondary | ICD-10-CM

## 2016-04-22 ENCOUNTER — Encounter: Payer: Self-pay | Admitting: Family Medicine

## 2016-04-25 DIAGNOSIS — F411 Generalized anxiety disorder: Secondary | ICD-10-CM | POA: Diagnosis not present

## 2016-05-01 NOTE — Progress Notes (Signed)
[name] [address] [place], [place] [address] Phone: [phone] Subjective:     CC: bilateral wrist pain f/u   [id number]  Kristie Fitzgerald is a 25 y.o. female coming in with complaint bilateral wrist pain. Patient was seen previously and was diagnosed with more of a bilateral de Quervain's tenosynovitis. Patient elected to try conservative therapy. Was given injections. Patient seems to have plateaued. Continues to have pain. Has had to leave work early 2 times in the neck to the discomfort in the wrist bilaterally. Patient states it is complete with some numbness as well. But this seems to be more intermittent. Patient denies any significant neck pain.  Back pain is better. Was found to have kidney stones that did cause a kidney infection. Doing better after the antibiotics. States still some mild tightness in the neck.  Past Medical History:  Diagnosis Date  . ADD (attention deficit disorder)   . Anxiety   . Chronic tonsillitis 06/2015  . Dental crown present   . Frequent UTI   . Seasonal allergies   . Tonsillar calculus 06/2015   Past Surgical History:  Procedure Laterality Date  . MASS EXCISION N/A 03/22/2013   Procedure: EXCISION MASS;  Surgeon: Rolm Bookbinder, MD;  Location: [facility];  Service: General;  Laterality: N/A;  . TONSILLECTOMY N/A 07/03/2015   Procedure: TONSILLECTOMY;  Surgeon: Izora Gala, MD;  Location: [place];  Service: ENT;  Laterality: N/A;  . WISDOM TOOTH EXTRACTION     Social History  Substance Use Topics  . Smoking status: Never Smoker  . Smokeless tobacco: Never Used  . Alcohol use Yes     Comment: socially   Family History  Problem Relation Age of Onset  . Cancer Father     melanoma  Aunt with lupus.  No Known Allergiesno known drug allergies     Past medical history, social, surgical and family history all reviewed in electronic medical record.   Review of Systems: No headache,  visual changes, nausea, vomiting, diarrhea, constipation, dizziness, abdominal pain, skin rash, fevers, chills, night sweats, weight loss, swollen lymph nodes chest pain, shortness of breath, mood changes. ,Positive for body aches, joint swelling, muscle aches,  Objective  Blood pressure 116/82, height 5\' 9"  (1.753 m), weight 194 lb (88 kg).  General: No apparent distress alert and oriented x3 mood and affect normal, dressed appropriately.  HEENT: Pupils equal, extraocular movements intact  Respiratory: Patient's speak in full sentences and does not appear short of breath  Cardiovascular: No lower extremity edema, non tender, no erythema  Skin: Warm dry intact with no signs of infection or rash on extremities or on axial skeleton.  Abdomen: Soft nontender  Neuro: Cranial nerves II through XII are intact, neurovascularly intact in all extremities with 2+ DTRs and 2+ pulses.  Lymph: No lymphadenopathy of posterior or anterior cervical chain or axillae bilaterally.  Gait normal with good balance and coordination.  MSK:  Non tender with full range of motion and good stability and symmetric strength and tone of  elbows, wrist, hip, knee and ankles bilaterally.   Neck: Inspection unremarkable. Still poor posture overall. Negative Spurling's maneuver. Less tightness and previous exam. Full range of motion Grip strength and sensation normal in bilateral hands Strength good C4 to T1 distribution No sensory change to C4 to T1 Negative Hoffman sign bilaterally Reflexes normal  Wrist bilateral No erythema no swelling  ROM smooth and normal with good flexion and extension  and ulnar/radial deviation that is symmetrical with opposite wrist.she does have discomfort with range of motion testing though. Palpation is normal over metacarpals, navicular, lunate, and TFCC; tendons without tenderness/ swelling No snuffbox tenderness. No tenderness over Canal of [place]. Strength 5/5 in all directions without  pain. Positive pain with Finkelstein's as well as Tinel's Negative Watson's test. Significant improvement.    Osteopathic findings C2 flexed rotated and side bent left T3 extended rotated and side bent left with inhaled third rib T7 extended rotated and side bent right L2 flexed rotated inside that right Sacrum left on left     Impression and Recommendations:     This case required medical decision making of moderate complexity.

## 2016-05-02 ENCOUNTER — Ambulatory Visit (INDEPENDENT_AMBULATORY_CARE_PROVIDER_SITE_OTHER): Payer: BLUE CROSS/BLUE SHIELD | Admitting: Family Medicine

## 2016-05-02 ENCOUNTER — Encounter: Payer: Self-pay | Admitting: Family Medicine

## 2016-05-02 VITALS — BP 116/82 | Ht 69.0 in | Wt 194.0 lb

## 2016-05-02 DIAGNOSIS — M654 Radial styloid tenosynovitis [de Quervain]: Secondary | ICD-10-CM | POA: Diagnosis not present

## 2016-05-02 DIAGNOSIS — M9908 Segmental and somatic dysfunction of rib cage: Secondary | ICD-10-CM

## 2016-05-02 DIAGNOSIS — M25531 Pain in right wrist: Secondary | ICD-10-CM

## 2016-05-02 DIAGNOSIS — M999 Biomechanical lesion, unspecified: Secondary | ICD-10-CM

## 2016-05-02 DIAGNOSIS — M2550 Pain in unspecified joint: Secondary | ICD-10-CM

## 2016-05-02 DIAGNOSIS — M9901 Segmental and somatic dysfunction of cervical region: Secondary | ICD-10-CM | POA: Diagnosis not present

## 2016-05-02 DIAGNOSIS — M25532 Pain in left wrist: Secondary | ICD-10-CM

## 2016-05-02 DIAGNOSIS — M9902 Segmental and somatic dysfunction of thoracic region: Secondary | ICD-10-CM | POA: Diagnosis not present

## 2016-05-02 MED ORDER — VENLAFAXINE HCL ER 37.5 MG PO CP24: 37.5000 mg | ORAL_CAPSULE | Freq: Every day | ORAL | 1 refills | Status: DC

## 2016-05-02 NOTE — Patient Instructions (Signed)
Good to see you  EMG will be calling you Effexor 37.5 mg daily  ICe still is good Good luck with everything else See me again in 3 weeks and we will see if we need to increase the dose.

## 2016-05-02 NOTE — Assessment & Plan Note (Signed)
Still difficult. Patient's autoimmune workup was unremarkable. Psoriatic arthritis, lupus as well as rheumatoid arthritis is all within the differential. We discussed icing regimen, we discussed Effexor for any, which regional pain syndrome or chronic pain syndrome could be contributing. We will see how patient response. Follow-up in 3-4 weeks and if worsening symptoms or improvement we'll consider changing medications.

## 2016-05-02 NOTE — Assessment & Plan Note (Signed)
Decision today to treat with OMT was based on Physical Exam  After verbal consent patient was treated with HVLA, ME, FPR techniques in cervical, thoracic and rib areas  Patient tolerated the procedure well with improvement in symptoms  Patient given exercises, stretches and lifestyle modifications  See medications in patient instructions if given  Patient will follow up in 3-4 weeks      

## 2016-05-02 NOTE — Assessment & Plan Note (Signed)
Continues to have bilateral wrist pain. Has had de Quervain's tenosynovitis as well as we have attempted injection with some mild improvement. Continues to have the pain. I do want to have patient have an EMG to further evaluate for any type of nerve related injury. Patient will also be started on Effexor in case this is more of a complicated regional pain syndrome. Patient may need to consider, choices. Patient is awaiting occupational therapy for her hands. Patient will come back and see me again in 3-4 weeks.

## 2016-05-09 ENCOUNTER — Ambulatory Visit: Payer: BLUE CROSS/BLUE SHIELD | Attending: Family Medicine | Admitting: Occupational Therapy

## 2016-05-09 ENCOUNTER — Encounter: Payer: Self-pay | Admitting: Occupational Therapy

## 2016-05-09 DIAGNOSIS — M25531 Pain in right wrist: Secondary | ICD-10-CM | POA: Diagnosis not present

## 2016-05-09 DIAGNOSIS — M6281 Muscle weakness (generalized): Secondary | ICD-10-CM | POA: Insufficient documentation

## 2016-05-09 DIAGNOSIS — M25532 Pain in left wrist: Secondary | ICD-10-CM | POA: Insufficient documentation

## 2016-05-09 DIAGNOSIS — R208 Other disturbances of skin sensation: Secondary | ICD-10-CM

## 2016-05-09 NOTE — Therapy (Signed)
[facility] [address] [place], [place], [address] Phone: [phone]   Fax:  [phone]  Occupational Therapy Evaluation  Patient Details  Name: Kristie Fitzgerald MRN: [id number] Date of Birth: [birth year] Referring Provider: Dr. Drusilla Kanner, DO  Encounter Date: 05/09/2016      OT End of Session - 05/09/16 1602    Visit Number 1   Number of Visits 13   Date for OT Re-Evaluation 06/20/16   Authorization Type BC/BS   Authorization - Visit Number 1   Authorization - Number of Visits 26   OT Start Time 1400   OT Stop Time 1445   OT Time Calculation (min) 45 min   Activity Tolerance Patient tolerated treatment well      Past Medical History:  Diagnosis Date  . ADD (attention deficit disorder)   . Anxiety   . Chronic tonsillitis 06/2015  . Dental crown present   . Frequent UTI   . Seasonal allergies   . Tonsillar calculus 06/2015    Past Surgical History:  Procedure Laterality Date  . MASS EXCISION N/A 03/22/2013   Procedure: EXCISION MASS;  Surgeon: Rolm Bookbinder, MD;  Location: [facility];  Service: General;  Laterality: N/A;  . TONSILLECTOMY N/A 07/03/2015   Procedure: TONSILLECTOMY;  Surgeon: Izora Gala, MD;  Location: [facility];  Service: ENT;  Laterality: N/A;  . WISDOM TOOTH EXTRACTION      There were no vitals filed for this visit.      Subjective Assessment - 05/09/16 1413    Pertinent History DeQuervains tenosynovitis bilaterally   Patient Stated Goals to stop hurting   Currently in Pain? Yes   Pain Score 7    Pain Location Hand   Pain Orientation Right;Left   Pain Descriptors / Indicators Aching;Shooting;Stabbing   Pain Type Acute pain   Pain Onset More than a month ago   Pain Frequency Intermittent   Aggravating Factors  typing, driving, texting   Pain Relieving Factors rest, heat, meds           [facility] OT Assessment - 05/09/16 0001      Assessment    Diagnosis Bilateral wrist pain  bilateral DeQuervains, bilateral CTS   Referring Provider Dr. Drusilla Kanner, DO   Onset Date --  April 2017   Assessment some intrinsic wasting bilateral hands   Prior Therapy none     Precautions   Precautions None     Restrictions   Weight Bearing Restrictions No     Balance Screen   Has the patient fallen in the past 6 months No   Has the patient had a decrease in activity level because of a fear of falling?  No   Is the patient reluctant to leave their home because of a fear of falling?  No     Home  Environment   Lives With --  parents     Prior Function   Level of Independence Independent   Vocation Full time employment   Vocation Requirements scribe, but going into phlebotomy   Leisure spin class     ADL   ADL comments Independent with BADLS. Pain with writing and typing     IADL   Shopping Takes care of all shopping needs independently   Light Housekeeping Performs light daily tasks such as dishwashing, bed making;Does personal laundry completely  vacuuming with pain   Meal Prep Plans, prepares and serves adequate meals independently   Programmer, applications own  vehicle   Medication Management Is responsible for taking medication in correct dosages at correct time   Financial Management Manages financial matters independently (budgets, writes checks, pays rent, bills goes to bank), collects and keeps track of income     Mobility   Mobility Status Independent     Written Expression   Dominant Hand Right   Handwriting --  no changes in lebility     Vision - History   Baseline Vision Wears glasses only for reading   Additional Comments strabismus     Observation/Other Assessments   Observations Per last MD office note - pt positive for Finklesteins and Tinels bilaterally. Pt also reports parathesias along ulnar n. distribution today in Rt hand     Sensation   Light Touch Appears Intact  for localization and 2 pt  discrimination     Coordination   9 Hole Peg Test Right;Left   Right 9 Hole Peg Test 21.69 sec   Left 9 Hole Peg Test 23.56 sec     Edema   Edema none at evaluation     ROM / Strength   AROM / PROM / Strength AROM     AROM   Overall AROM Comments BUE AROM WNL's     Hand Function   Right Hand Grip (lbs) 60 lbs   Right Hand Lateral Pinch 13 lbs   Right Hand 3 Point Pinch 13 lbs   Left Hand Grip (lbs) 55   Left Hand Lateral Pinch 12.5 lbs   Left 3 point pinch 14 lbs                         OT Education - 05/09/16 1612    Education provided Yes   Education Details proper use of heat/cold modalities    Person(s) Educated Patient   Methods Explanation   Comprehension Verbalized understanding             OT Long Term Goals - 05/09/16 1610      OT LONG TERM GOAL #1   Title Independent with HEP   Time 6   Period Weeks   Status New     OT LONG TERM GOAL #2   Title Independent with pain management strategies including: splints, A/E, positioning, modalities, and compensatory strategies   Time 6   Period Weeks   Status New     OT LONG TERM GOAL #3   Title Pain less than or equal to 5/10 for day to day activities   Baseline 7/10   Time 6   Period Weeks   Status New               Plan - 05/09/16 1607    Clinical Impression Statement Pt is a 25 y.o. female who presents to outpatient rehab with bilateral wrist pain with h/o bilateral DeQuervains tenosynovitis and bilateral CTS. Pt reports pain is interfering with her ability to perform job duties   Rehab Potential Good   OT Frequency 2x / week   OT Duration 6 weeks  plus evaluation   OT Treatment/Interventions Self-care/ADL training;Moist Heat;Fluidtherapy;DME and/or AE instruction;Splinting;Patient/family education;Compression bandaging;Therapeutic exercises;Contrast Bath;Ultrasound;Therapeutic exercise;Scar mobilization;Therapeutic activities;Cryotherapy;Passive range of motion;Electrical  Stimulation;Parrafin;Iontophoresis;Manual Therapy;Dry needling   Plan fluidotherapy, CTS conservative management handout, adaptive strategies, pt also to bring in braces to assess if appropriate (check on BC/BS authorization)    Consulted and Agree with Plan of Care Patient      Patient will benefit from skilled therapeutic intervention  in order to improve the following deficits and impairments:  Decreased coordination, Impaired sensation, Decreased activity tolerance, Impaired UE functional use, Pain, Decreased strength  Visit Diagnosis: Pain in right wrist - Plan: Ot plan of care cert/re-cert  Pain in left wrist - Plan: Ot plan of care cert/re-cert  Muscle weakness (generalized) - Plan: Ot plan of care cert/re-cert  Other disturbances of skin sensation - Plan: Ot plan of care cert/re-cert    Problem List Patient Active Problem List   Diagnosis Date Noted  . Polyarthralgia 03/07/2016  . De Quervain's tenosynovitis, bilateral 02/15/2016  . Bilateral wrist pain 02/06/2016  . De Quervain's tenosynovitis, right 01/17/2016  . Muscle spasms of neck 10/05/2015  . Scapular dysfunction 05/02/2015  . Nonallopathic lesion of cervical region 05/02/2015  . Nonallopathic lesion of thoracic region 05/02/2015  . Nonallopathic lesion-rib cage 05/02/2015  . Lipoma of back 02/23/2013  . UTI (lower urinary tract infection) 08/18/2012  . Fibrocystic breast disease 06/02/2012  . Routine general medical examination at a health care facility 06/02/2012  . Dysplastic nevus of upper extremity 03/11/2012  . Depression 03/10/2012  . ASCUS PAP 09/28/2010  . Dysfunctional uterine bleeding 03/30/2010    Carey Bullocks, OTR/L 05/09/2016, 4:15 PM  [facility] [address] [place], [place], [address] Phone: [phone]   Fax:  [phone]  Name: Kristie Fitzgerald MRN: [id number] Date of Birth: March 08, 1991

## 2016-05-17 ENCOUNTER — Encounter: Payer: Self-pay | Admitting: Family Medicine

## 2016-05-20 ENCOUNTER — Ambulatory Visit: Payer: BLUE CROSS/BLUE SHIELD | Admitting: Occupational Therapy

## 2016-05-20 DIAGNOSIS — M6281 Muscle weakness (generalized): Secondary | ICD-10-CM | POA: Diagnosis not present

## 2016-05-20 DIAGNOSIS — M25532 Pain in left wrist: Secondary | ICD-10-CM | POA: Diagnosis not present

## 2016-05-20 DIAGNOSIS — M25531 Pain in right wrist: Secondary | ICD-10-CM | POA: Diagnosis not present

## 2016-05-20 DIAGNOSIS — R208 Other disturbances of skin sensation: Secondary | ICD-10-CM

## 2016-05-20 NOTE — Therapy (Signed)
[facility] [address] [place], [place], [address] Phone: [phone]   Fax:  [phone]  Occupational Therapy Treatment  Patient Details  Name: Kristie Fitzgerald MRN: [id number] Date of Birth: 01-Dec-1990 Referring Provider: Dr. Drusilla Kanner, DO  Encounter Date: 05/20/2016      OT End of Session - 05/20/16 1246    Visit Number 2   Number of Visits 13   Date for OT Re-Evaluation 06/20/16   Authorization - Visit Number 2   Authorization - Number of Visits 26   OT Start Time [phone]   OT Stop Time 0930   OT Time Calculation (min) 39 min   Activity Tolerance Patient tolerated treatment well   Behavior During Therapy [facility] for tasks assessed/performed      Past Medical History:  Diagnosis Date  . ADD (attention deficit disorder)   . Anxiety   . Chronic tonsillitis 06/2015  . Dental crown present   . Frequent UTI   . Seasonal allergies   . Tonsillar calculus 06/2015    Past Surgical History:  Procedure Laterality Date  . MASS EXCISION N/A 03/22/2013   Procedure: EXCISION MASS;  Surgeon: Rolm Bookbinder, MD;  Location: [facility];  Service: General;  Laterality: N/A;  . TONSILLECTOMY N/A 07/03/2015   Procedure: TONSILLECTOMY;  Surgeon: Izora Gala, MD;  Location: [facility];  Service: ENT;  Laterality: N/A;  . WISDOM TOOTH EXTRACTION      There were no vitals filed for this visit.      Subjective Assessment - 05/20/16 1016    Subjective  Pt reports she has had a few days off and she feels better   Pertinent History DeQuervains tenosynovitis bilaterally   Patient Stated Goals to stop hurting   Currently in Pain? No/denies                  Therapist discussed activity modification and instructed pt in exercises fromm conservative management of carpal tunnel. Pt to bring in splints next visit for splint check.            OT Education - 05/20/16 0920    Education provided  Yes   Education Details splint wear schedule, conservative management of carpal tunnel handout, exercises 1, 5, 6, 7 issued   Person(s) Educated Patient   Methods Explanation;Demonstration;Verbal cues;Handout   Comprehension Verbalized understanding             OT Long Term Goals - 05/09/16 1610      OT LONG TERM GOAL #1   Title Independent with HEP   Time 6   Period Weeks   Status New     OT LONG TERM GOAL #2   Title Independent with pain management strategies including: splints, A/E, positioning, modalities, and compensatory strategies   Time 6   Period Weeks   Status New     OT LONG TERM GOAL #3   Title Pain less than or equal to 5/10 for day to day activities   Baseline 7/10   Time 6   Period Weeks   Status New               Plan - 05/20/16 1245    Clinical Impression Statement Pt is progressing towards goals. She verbalizes understanding of positions to avoid and inital HEP.   Rehab Potential Good   OT Frequency 2x / week   OT Duration 6 weeks   OT Treatment/Interventions Self-care/ADL training;Moist Heat;Fluidtherapy;DME and/or AE instruction;Splinting;Patient/family education;Compression  bandaging;Therapeutic exercises;Contrast Bath;Ultrasound;Therapeutic exercise;Scar mobilization;Therapeutic activities;Cryotherapy;Passive range of motion;Electrical Stimulation;Parrafin;Iontophoresis;Manual Therapy;Dry needling   Plan reinforce, ptogress HEP, splint assessment if pt brings in   Consulted and Agree with Plan of Care Patient      Patient will benefit from skilled therapeutic intervention in order to improve the following deficits and impairments:  Decreased coordination, Impaired sensation, Decreased activity tolerance, Impaired UE functional use, Pain, Decreased strength  Visit Diagnosis: Pain in right wrist  Pain in left wrist  Muscle weakness (generalized)  Other disturbances of skin sensation    Problem List Patient Active Problem List    Diagnosis Date Noted  . Polyarthralgia 03/07/2016  . De Quervain's tenosynovitis, bilateral 02/15/2016  . Bilateral wrist pain 02/06/2016  . De Quervain's tenosynovitis, right 01/17/2016  . Muscle spasms of neck 10/05/2015  . Scapular dysfunction 05/02/2015  . Nonallopathic lesion of cervical region 05/02/2015  . Nonallopathic lesion of thoracic region 05/02/2015  . Nonallopathic lesion-rib cage 05/02/2015  . Lipoma of back 02/23/2013  . UTI (lower urinary tract infection) 08/18/2012  . Fibrocystic breast disease 06/02/2012  . Routine general medical examination at a health care facility 06/02/2012  . Dysplastic nevus of upper extremity 03/11/2012  . Depression 03/10/2012  . ASCUS PAP 09/28/2010  . Dysfunctional uterine bleeding 03/30/2010    Kristie Fitzgerald 05/20/2016, 12:50 PM Theone Murdoch, OTR/L [phone] Phone: [phone] 12:50 PM 05/20/16 [facility] [address] [place], [place], [address] Phone: [phone]   Fax:  [phone]  Name: Kristie Fitzgerald MRN: [id number] Date of Birth: 1991-01-09

## 2016-05-23 ENCOUNTER — Ambulatory Visit: Payer: BLUE CROSS/BLUE SHIELD | Admitting: Family Medicine

## 2016-05-29 ENCOUNTER — Other Ambulatory Visit: Payer: Self-pay | Admitting: *Deleted

## 2016-05-29 DIAGNOSIS — G609 Hereditary and idiopathic neuropathy, unspecified: Secondary | ICD-10-CM

## 2016-05-30 ENCOUNTER — Ambulatory Visit: Payer: BLUE CROSS/BLUE SHIELD | Admitting: Family Medicine

## 2016-05-31 ENCOUNTER — Ambulatory Visit: Payer: BLUE CROSS/BLUE SHIELD | Admitting: Occupational Therapy

## 2016-06-03 ENCOUNTER — Other Ambulatory Visit: Payer: Self-pay | Admitting: Family Medicine

## 2016-06-03 NOTE — Telephone Encounter (Signed)
Refill done.  

## 2016-06-04 ENCOUNTER — Encounter: Payer: BLUE CROSS/BLUE SHIELD | Admitting: Occupational Therapy

## 2016-06-04 ENCOUNTER — Ambulatory Visit (INDEPENDENT_AMBULATORY_CARE_PROVIDER_SITE_OTHER): Payer: BLUE CROSS/BLUE SHIELD | Admitting: Neurology

## 2016-06-04 DIAGNOSIS — M79602 Pain in left arm: Principal | ICD-10-CM

## 2016-06-04 DIAGNOSIS — G609 Hereditary and idiopathic neuropathy, unspecified: Secondary | ICD-10-CM | POA: Diagnosis not present

## 2016-06-04 DIAGNOSIS — M79601 Pain in right arm: Secondary | ICD-10-CM

## 2016-06-04 NOTE — Procedures (Signed)
[facility]  [place], [place]  [place], [place] [address] Tel: [phone] Fax:  [phone] Test Date:  06/04/2016  Patient: Kristie Fitzgerald DOB: [birth year] Physician: Narda Amber, DO  Sex: Female Height: 5\' 9"  Ref Phys: Charlann Boxer, M.D.  ID#: [id number] Temp: 35.1C Technician: Jerilynn Mages. Dean   Patient Complaints: This is a 25 year old female referred for evaluation of bilateral wrist pain and hand numbness.  NCV & EMG Findings: Extensive electrodiagnostic testing of the right upper extremity and additional studies of the left shows: 1. Bilateral median, ulnar, and mixed palmar sensory responses are within normal limits. 2. Bilateral median and ulnar motor responses are within normal limits. 3. There is no evidence of active or chronic motor axon loss changes affecting any of the tested muscles. Motor unit configuration and recruitment pattern is within normal limits.  Impression: This is a normal study of the upper extremities. In particular, there is no evidence of a cervical radiculopathy or carpal tunnel syndrome.   ___________________________ Narda Amber, DO    Nerve Conduction Studies Anti Sensory Summary Table   Stim Site NR Peak (ms) Norm Peak (ms) P-T Amp (V) Norm P-T Amp  Left Median Anti Sensory (2nd Digit)  35.1C  Wrist    2.8 <3.3 40.9 >20  Right Median Anti Sensory (2nd Digit)  35.1C  Wrist    2.7 <3.3 34.2 >20  Left Ulnar Anti Sensory (5th Digit)  35.1C  Wrist    2.8 <3.0 37.0 >18  Right Ulnar Anti Sensory (5th Digit)  35.1C  Wrist    2.8 <3.0 42.2 >18   Motor Summary Table   Stim Site NR Onset (ms) Norm Onset (ms) O-P Amp (mV) Norm O-P Amp Site1 Site2 Delta-0 (ms) Dist (cm) Vel (m/s) Norm Vel (m/s)  Left Median Motor (Abd Poll Brev)  35.1C  Wrist    2.7 <3.9 8.3 >6 Elbow Wrist 3.9 23.0 59 >51  Elbow    6.6  8.3         Right Median Motor (Abd Poll Brev)  35.1C  Wrist    2.6 <3.9 7.2 >6 Elbow Wrist 4.0 23.0 58 >51  Elbow    6.6   7.2         Left Ulnar Motor (Abd Dig Minimi)  35.1C  Wrist    2.3 <3.0 9.7 >8 B Elbow Wrist 3.2 18.0 56 >51  B Elbow    5.5  8.8  A Elbow B Elbow 1.8 10.0 56 >51  A Elbow    7.3  8.5         Right Ulnar Motor (Abd Dig Minimi)  35.1C  Wrist    2.3 <3.0 9.4 >8 B Elbow Wrist 3.6 20.0 56 >51  B Elbow    5.9  9.2  A Elbow B Elbow 1.5 10.0 67 >51  A Elbow    7.4  8.7          Comparison Summary Table   Stim Site NR Peak (ms) Norm Peak (ms) P-T Amp (V) Site1 Site2 Delta-P (ms) Norm Delta (ms)  Left Median/Ulnar Palm Comparison (Wrist - 8cm)  35.1C  Median Palm    1.7 <2.2 41.4 Median Palm Ulnar Palm 0.0   Ulnar Palm    1.7 <2.2 29.0      Right Median/Ulnar Palm Comparison (Wrist - 8cm)  35.1C  Median Palm    1.6 <2.2 57.6 Median Palm Ulnar Palm 0.0   Ulnar Palm    1.6 <2.2 12.7  EMG   Side Muscle Ins Act Fibs Psw Fasc Number Recrt Dur Dur. Amp Amp. Poly Poly. Comment  Right 1stDorInt Nml Nml Nml Nml Nml Nml Nml Nml Nml Nml Nml Nml N/A  Right Ext Indicis Nml Nml Nml Nml Nml Nml Nml Nml Nml Nml Nml Nml N/A  Right PronatorTeres Nml Nml Nml Nml Nml Nml Nml Nml Nml Nml Nml Nml N/A  Right Biceps Nml Nml Nml Nml Nml Nml Nml Nml Nml Nml Nml Nml N/A  Right Triceps Nml Nml Nml Nml Nml Nml Nml Nml Nml Nml Nml Nml N/A  Right Deltoid Nml Nml Nml Nml Nml Nml Nml Nml Nml Nml Nml Nml N/A  Left 1stDorInt Nml Nml Nml Nml Nml Nml Nml Nml Nml Nml Nml Nml N/A  Left Ext Indicis Nml Nml Nml Nml Nml Nml Nml Nml Nml Nml Nml Nml N/A  Left PronatorTeres Nml Nml Nml Nml Nml Nml Nml Nml Nml Nml Nml Nml N/A  Left FlexPolLong Nml Nml Nml Nml Nml Nml Nml Nml Nml Nml Nml Nml N/A  Right FlexPolLong Nml Nml Nml Nml Nml Nml Nml Nml Nml Nml Nml Nml N/A      Waveforms:

## 2016-06-06 ENCOUNTER — Ambulatory Visit: Payer: BLUE CROSS/BLUE SHIELD | Admitting: Occupational Therapy

## 2016-06-10 ENCOUNTER — Encounter: Payer: Self-pay | Admitting: Family Medicine

## 2016-06-11 ENCOUNTER — Ambulatory Visit: Payer: BLUE CROSS/BLUE SHIELD | Admitting: Occupational Therapy

## 2016-06-18 ENCOUNTER — Encounter: Payer: BLUE CROSS/BLUE SHIELD | Admitting: Occupational Therapy

## 2016-06-19 NOTE — Progress Notes (Signed)
[facility] [place], [place] [address] Phone: [phone] Subjective:     CC: bilateral wrist pain f/u   [id number]  Kristie Fitzgerald is a 25 y.o. female coming in with complaint bilateral wrist pain. Patient was seen previously and was diagnosed with more of a bilateral de Quervain's tenosynovitis. Seems to be doing better overall. Was started on Effexor for some stress.  Back pain is better. Was found to have kidney stones that did cause a kidney infection. Doing better after the antibiotics. States still some mild tightness in the neck.Still has some tightness. We discussed icing regimen. Patient is doing intimately. Patient has changed, which has been helpful as well.   Past Medical History:  Diagnosis Date  . ADD (attention deficit disorder)   . Anxiety   . Chronic tonsillitis 06/2015  . Dental crown present   . Frequent UTI   . Seasonal allergies   . Tonsillar calculus 06/2015   Past Surgical History:  Procedure Laterality Date  . MASS EXCISION N/A 03/22/2013   Procedure: EXCISION MASS;  Surgeon: Rolm Bookbinder, MD;  Location: [facility];  Service: General;  Laterality: N/A;  . TONSILLECTOMY N/A 07/03/2015   Procedure: TONSILLECTOMY;  Surgeon: Izora Gala, MD;  Location: [facility];  Service: ENT;  Laterality: N/A;  . WISDOM TOOTH EXTRACTION     Social History  Substance Use Topics  . Smoking status: Never Smoker  . Smokeless tobacco: Never Used  . Alcohol use Yes     Comment: socially   Family History  Problem Relation Age of Onset  . Cancer Father     melanoma  Aunt with lupus.  No Known Allergiesno known drug allergies     Past medical history, social, surgical and family history all reviewed in electronic medical record.   Review of Systems: No headache, visual changes, nausea, vomiting, diarrhea, constipation, dizziness, abdominal pain, skin rash, fevers, chills, night sweats, weight loss,  swollen lymph nodes chest pain, shortness of breath, mood changes. ,Positive for body aches, joint swelling, muscle aches,  Objective  Blood pressure 116/82, pulse 75, weight 193 lb 9.6 oz (87.8 kg).  General: No apparent distress alert and oriented x3 mood and affect normal, dressed appropriately.  HEENT: Pupils equal, extraocular movements intact  Respiratory: Patient's speak in full sentences and does not appear short of breath  Cardiovascular: No lower extremity edema, non tender, no erythema  Skin: Warm dry intact with no signs of infection or rash on extremities or on axial skeleton.  Abdomen: Soft nontender  Neuro: Cranial nerves II through XII are intact, neurovascularly intact in all extremities with 2+ DTRs and 2+ pulses.  Lymph: No lymphadenopathy of posterior or anterior cervical chain or axillae bilaterally.  Gait normal with good balance and coordination.  MSK:  Non tender with full range of motion and good stability and symmetric strength and tone of  elbows, wrist, hip, knee and ankles bilaterally.   Neck: Inspection unremarkable. Still poor posture overall.  Full range of motion Grip strength and sensation normal in bilateral hands Strength good C4 to T1 distribution No sensory change to C4 to T1 Negative Hoffman sign bilaterally Reflexes normal  Wrist bilateral No erythema no swelling  ROM smooth and normal with good flexion and extension and ulnar/radial deviation that is symmetrical with opposite wrist.she does have discomfort with range of motion testing though. Palpation is normal over metacarpals, navicular, lunate, and TFCC; tendons without tenderness/ swelling No snuffbox  tenderness. No tenderness over Canal of [place]. Strength 5/5 in all directions without pain. Negative pain with Finkelstein's as well as Tinel's Negative Watson's test. Significant improvement.    Osteopathic findings C4 flexed rotated and side bent left T3 extended rotated and side bent  left with inhaled third rib T7 extended rotated and side bent right L2 flexed rotated inside that right Sacrum left on left     Impression and Recommendations:     This case required medical decision making of moderate complexity.

## 2016-06-20 ENCOUNTER — Ambulatory Visit (INDEPENDENT_AMBULATORY_CARE_PROVIDER_SITE_OTHER): Payer: BLUE CROSS/BLUE SHIELD | Admitting: Family Medicine

## 2016-06-20 ENCOUNTER — Encounter: Payer: BLUE CROSS/BLUE SHIELD | Admitting: Occupational Therapy

## 2016-06-20 ENCOUNTER — Encounter: Payer: Self-pay | Admitting: Family Medicine

## 2016-06-20 VITALS — BP 116/82 | HR 75 | Wt 193.6 lb

## 2016-06-20 DIAGNOSIS — M6248 Contracture of muscle, other site: Secondary | ICD-10-CM

## 2016-06-20 DIAGNOSIS — M9908 Segmental and somatic dysfunction of rib cage: Secondary | ICD-10-CM | POA: Diagnosis not present

## 2016-06-20 DIAGNOSIS — M62838 Other muscle spasm: Secondary | ICD-10-CM

## 2016-06-20 DIAGNOSIS — F32A Depression, unspecified: Secondary | ICD-10-CM

## 2016-06-20 DIAGNOSIS — M9902 Segmental and somatic dysfunction of thoracic region: Secondary | ICD-10-CM | POA: Diagnosis not present

## 2016-06-20 DIAGNOSIS — M999 Biomechanical lesion, unspecified: Secondary | ICD-10-CM

## 2016-06-20 DIAGNOSIS — F329 Major depressive disorder, single episode, unspecified: Secondary | ICD-10-CM | POA: Diagnosis not present

## 2016-06-20 DIAGNOSIS — M9901 Segmental and somatic dysfunction of cervical region: Secondary | ICD-10-CM

## 2016-06-20 MED ORDER — VENLAFAXINE HCL ER 75 MG PO CP24: 75.0000 mg | ORAL_CAPSULE | Freq: Every day | ORAL | 3 refills | Status: DC

## 2016-06-20 NOTE — Assessment & Plan Note (Signed)
I do believe that some of the pain is secondary to the depression an underlying anxiety disorder. Responding very well to the Effexor. Increase to 75 mg. Encourage patient to decrease her gabapentin to 100 mg at night and see if we can decrease his overall. Encourage her to continue to be active. Patient come back and see me again in 2-3 months.

## 2016-06-20 NOTE — Assessment & Plan Note (Signed)
Decision today to treat with OMT was based on Physical Exam  After verbal consent patient was treated with HVLA, ME, FPR techniques in cervical, thoracic and rib areas  Patient tolerated the procedure well with improvement in symptoms  Patient given exercises, stretches and lifestyle modifications  See medications in patient instructions if given  Patient will follow up in 8-12 weeks

## 2016-06-20 NOTE — Patient Instructions (Addendum)
You are doing great  Remember to bring the bed up with your job to help back and the arm.  Increase effexor to 75 mg Ibuprofen for your throat.  Ice when you need it Good luck with school Tell me how you are doing in 3 weeks See me again in 2-3 months.

## 2016-06-21 DIAGNOSIS — H6523 Chronic serous otitis media, bilateral: Secondary | ICD-10-CM | POA: Diagnosis not present

## 2016-06-21 DIAGNOSIS — J00 Acute nasopharyngitis [common cold]: Secondary | ICD-10-CM | POA: Diagnosis not present

## 2016-07-15 ENCOUNTER — Other Ambulatory Visit: Payer: Self-pay | Admitting: *Deleted

## 2016-07-15 MED ORDER — VENLAFAXINE HCL ER 75 MG PO CP24: 75.0000 mg | ORAL_CAPSULE | Freq: Every day | ORAL | 0 refills | Status: DC

## 2016-07-18 ENCOUNTER — Encounter: Payer: Self-pay | Admitting: Family Medicine

## 2016-07-22 NOTE — Progress Notes (Signed)
[name] [place],  [address] Phone: [phone] Subjective:    CC: Back pain follow-up  [id number]  Kristie Fitzgerald is a 25 y.o. female coming in with complaint bilateral wrist pain. States that she is changing her job and M.D. injections is feeling significantly better. Also feels that the Effexor is making benefit.  Back pain. Had a recent flare again. Patient was having worsening pain is made her unfortunately not be able to even do regular daily activities. Denies any radiation down the legs any numbness or tingling. Patient rates the severity of pain is 7 out of 10 when it first occurred. Denies any pain at this time. States that it is just some mild tightness. Feels like she is improving slowly. Did respond well to anti-inflammatories. Has responded well to osteopathic manipulation in the past.   Past medical, surgical, social and family history all reviewed as of 07/23/16.  No pertanent information unless stated regarding to the chief complaint.  Past Medical History:  Diagnosis Date  . ADD (attention deficit disorder)   . Anxiety   . Chronic tonsillitis 06/2015  . Dental crown present   . Frequent UTI   . Seasonal allergies   . Tonsillar calculus 06/2015   Past Surgical History:  Procedure Laterality Date  . MASS EXCISION N/A 03/22/2013   Procedure: EXCISION MASS;  Surgeon: Rolm Bookbinder, MD;  Location: [facility];  Service: General;  Laterality: N/A;  . TONSILLECTOMY N/A 07/03/2015   Procedure: TONSILLECTOMY;  Surgeon: Izora Gala, MD;  Location: [facility];  Service: ENT;  Laterality: N/A;  . WISDOM TOOTH EXTRACTION     Social History   Social History  . Marital status: Single    Spouse name: N/A  . Number of children: N/A  . Years of education: N/A   Social History Main Topics  . Smoking status: Never Smoker  . Smokeless tobacco: Never Used  . Alcohol use Yes     Comment: socially  . Drug  use: No  . Sexual activity: Yes    Partners: Male    Birth control/ protection: Condom, Pill   Other Topics Concern  . None   Social History Narrative  . None   No Known Allergies Family History  Problem Relation Age of Onset  . Cancer Father     melanoma     Review of systems reviewed as of 07/23/16 Review of Systems: No headache, visual changes, nausea, vomiting, diarrhea, constipation, dizziness, abdominal pain, skin rash, fevers, chills, night sweats, weight loss, swollen lymph nodes, body aches, joint swelling, muscle aches, chest pain, shortness of breath, mood changes.   Objective  Blood pressure 106/78, pulse 83, height 5\' 9"  (1.753 m), weight 189 lb (85.7 kg), SpO2 97 %. Systems examined below as of 07/23/16   General: No apparent distress alert and oriented x3 mood and affect normal, dressed appropriately.  HEENT: Pupils equal, extraocular movements intact  Respiratory: Patient's speak in full sentences and does not appear short of breath  Cardiovascular: No lower extremity edema, non tender, no erythema  Skin: Warm dry intact with no signs of infection or rash on extremities or on axial skeleton.  Abdomen: Soft nontender  Neuro: Cranial nerves II through XII are intact, neurovascularly intact in all extremities with 2+ DTRs and 2+ pulses.  Lymph: No lymphadenopathy of posterior or anterior cervical chain or axillae bilaterally.  Gait normal with good balance and coordination.  MSK:  Non tender  with full range of motion and good stability and symmetric strength and tone of shoulders, elbows, wrist, hip, knee and ankles bilaterally.     Neck: Inspection unremarkable. Continued poor posture.  Full range of motion mild tightness with left-sided rotation Grip strength and sensation normal in bilateral hands Strength good C4 to T1 distribution No sensory change to C4 to T1 Negative Hoffman sign bilaterally Reflexes normal   Back Exam:  Inspection: Unremarkable    Motion: Flexion 45 deg, Extension 25 deg, Side Bending to 45 deg bilaterally,  Rotation to 45 deg bilaterally  SLR laying: Negative  XSLR laying: Negative  Palpable tenderness: Mild tenderness in the paraspinal musculature of the thoracal lumbar junction on the right side. FABER: negative. Sensory change: Gross sensation intact to all lumbar and sacral dermatomes.  Reflexes: 2+ at both patellar tendons, 2+ at achilles tendons, Babinski's downgoing.  Strength at foot  Plantar-flexion: 5/5 Dorsi-flexion: 5/5 Eversion: 5/5 Inversion: 5/5  Leg strength  Quad: 5/5 Hamstring: 5/5 Hip flexor: 5/5 Hip abductors: 5/5  Gait unremarkable.   Osteopathic findings C6 flexed rotated and side bent left T3 extended rotated and side bent left with inhaled third rib T9 extended rotated and side bent right L2 flexed rotated inside that right Sacrum left on left     Impression and Recommendations:     This case required medical decision making of moderate complexity.

## 2016-07-23 ENCOUNTER — Ambulatory Visit (INDEPENDENT_AMBULATORY_CARE_PROVIDER_SITE_OTHER): Payer: BLUE CROSS/BLUE SHIELD | Admitting: Family Medicine

## 2016-07-23 ENCOUNTER — Encounter: Payer: Self-pay | Admitting: Family Medicine

## 2016-07-23 VITALS — BP 106/78 | HR 83 | Ht 69.0 in | Wt 189.0 lb

## 2016-07-23 DIAGNOSIS — M24551 Contracture, right hip: Secondary | ICD-10-CM | POA: Diagnosis not present

## 2016-07-23 DIAGNOSIS — M999 Biomechanical lesion, unspecified: Secondary | ICD-10-CM

## 2016-07-23 DIAGNOSIS — M24559 Contracture, unspecified hip: Secondary | ICD-10-CM | POA: Insufficient documentation

## 2016-07-23 MED ORDER — CYCLOBENZAPRINE HCL 10 MG PO TABS: 10.0000 mg | ORAL_TABLET | Freq: Three times a day (TID) | ORAL | 0 refills | Status: DC | PRN

## 2016-07-23 NOTE — Assessment & Plan Note (Signed)
Decision today to treat with OMT was based on Physical Exam  After verbal consent patient was treated with HVLA, ME, FPR techniques in cervical, thoracic and rib areas  Patient tolerated the procedure well with improvement in symptoms  Patient given exercises, stretches and lifestyle modifications  See medications in patient instructions if given  Patient will follow up in 4-6 weeks    

## 2016-07-23 NOTE — Patient Instructions (Signed)
Good to see you  Either bring bed up or sit when drawing blood.  Ice is your friend  Keep working on posture Try the flexeril but start half dose  See me again in 4 weeks.

## 2016-07-23 NOTE — Assessment & Plan Note (Signed)
Patient did have more tightness at the thoracal lumbar junction at a think is secondary to hip flexors. We discussed ergonomics at work as well as home exercises. We discussed the importance of core strengthening. Patient will follow-up with me again in 4-6 weeks.

## 2016-07-26 DIAGNOSIS — F330 Major depressive disorder, recurrent, mild: Secondary | ICD-10-CM | POA: Diagnosis not present

## 2016-07-26 DIAGNOSIS — F411 Generalized anxiety disorder: Secondary | ICD-10-CM | POA: Diagnosis not present

## 2016-08-02 ENCOUNTER — Other Ambulatory Visit: Payer: Self-pay | Admitting: Family Medicine

## 2016-08-03 ENCOUNTER — Other Ambulatory Visit: Payer: Self-pay | Admitting: Family Medicine

## 2016-08-05 NOTE — Telephone Encounter (Signed)
Refill done.  

## 2016-08-19 NOTE — Progress Notes (Signed)
[facility] [place], [place] [address] Phone: [phone] Subjective:    CC: Back pain follow-up  [id number]  Kristie Fitzgerald is a 25 y.o. female coming in with complaint bilateral wrist pain. Patient was started on Effexor and we did increase dose at last exam. Patient states feeling very good at this time. Patient has talked or psychiatrist and they're going to start decreasing patient's Celexa. Patient think that she will do very well otherwise. Has having more decrease in daily pain.  Back pain. 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 Notices when she does not set up ergonomics it gives her more difficulty. Patient states still some mild pain but overall seems to be doing better. Still having some decreasing strength when she is in one position for too long she states.   Past medical, surgical, social and family history all reviewed as of 08/20/16.  No pertanent information unless stated regarding to the chief complaint.  Past Medical History:  Diagnosis Date  . ADD (attention deficit disorder)   . Anxiety   . Chronic tonsillitis 06/2015  . Dental crown present   . Frequent UTI   . Seasonal allergies   . Tonsillar calculus 06/2015   Past Surgical History:  Procedure Laterality Date  . MASS EXCISION N/A 03/22/2013   Procedure: EXCISION MASS;  Surgeon: Rolm Bookbinder, MD;  Location: [facility];  Service: General;  Laterality: N/A;  . TONSILLECTOMY N/A 07/03/2015   Procedure: TONSILLECTOMY;  Surgeon: Izora Gala, MD;  Location: [facility];  Service: ENT;  Laterality: N/A;  . WISDOM TOOTH EXTRACTION     Social History   Social History  . Marital status: Single    Spouse name: N/A  . Number of children: N/A  . Years of education: N/A   Social History Main Topics  . Smoking status: Never Smoker  . Smokeless tobacco: Never Used  . Alcohol use Yes     Comment: socially  . Drug use: No  . Sexual  activity: Yes    Partners: Male    Birth control/ protection: Condom, Pill   Other Topics Concern  . None   Social History Narrative  . None   No Known Allergies Family History  Problem Relation Age of Onset  . Cancer Father     melanoma     Review of systems reviewed as of 08/20/16 Review of Systems: No headache, visual changes, nausea, vomiting, diarrhea, constipation, dizziness, abdominal pain, skin rash, fevers, chills, night sweats, weight loss, swollen lymph nodes, body aches, joint swelling, muscle aches, chest pain, shortness of breath, mood changes.   Objective  Blood pressure 120/84, pulse (!) 102, height 5\' 9"  (1.753 m), weight 180 lb (81.6 kg), SpO2 97 %. Systems examined below as of 08/20/16   General: No apparent distress alert and oriented x3 mood and affect normal, dressed appropriately.  HEENT: Pupils equal, extraocular movements intact  Respiratory: Patient's speak in full sentences and does not appear short of breath  Cardiovascular: No lower extremity edema, non tender, no erythema  Skin: Warm dry intact with no signs of infection or rash on extremities or on axial skeleton.  Abdomen: Soft nontender  Neuro: Cranial nerves II through XII are intact, neurovascularly intact in all extremities with 2+ DTRs and 2+ pulses.  Lymph: No lymphadenopathy of posterior or anterior cervical chain or axillae bilaterally.  Gait normal with good balance and coordination.  MSK:  Non tender  with full range of motion and good stability and symmetric strength and tone of shoulders, elbows, wrist, hip, knee and ankles bilaterally.     Neck: Inspection unremarkable. Continued poor posture. Mild limitation in left-sided rotation. Grip strength and sensation normal in bilateral hands Strength good C4 to T1 distribution No sensory change to C4 to T1 Negative Hoffman sign bilaterally Reflexes normal   Back Exam:  Inspection: Unremarkable  Motion: Flexion 40 deg, Extension  20 deg, Side Bending to 35 deg bilaterally,  Rotation to 45 deg bilaterally  SLR laying: Negative  XSLR laying: Negative  Palpable tenderness: Mild discomfort still in the paraspinal musculature mostly at the thoracolumbar junction on the right side. FABER: negative. Sensory change: Gross sensation intact to all lumbar and sacral dermatomes.  Reflexes: 2+ at both patellar tendons, 2+ at achilles tendons, Babinski's downgoing.  Strength at foot  Plantar-flexion: 5/5 Dorsi-flexion: 5/5 Eversion: 5/5 Inversion: 5/5  Leg strength  Quad: 5/5 Hamstring: 5/5 Hip flexor: 5/5 Hip abductors: 5/5  Gait unremarkable.   Osteopathic findings C6 flexed rotated and side bent left T3 extended rotated and side bent left with inhaled third rib T7 extended rotated and side bent right L2 flexed rotated inside that right Sacrum left on left     Impression and Recommendations:     This case required medical decision making of moderate complexity.

## 2016-08-20 ENCOUNTER — Encounter: Payer: Self-pay | Admitting: Family Medicine

## 2016-08-20 ENCOUNTER — Ambulatory Visit (INDEPENDENT_AMBULATORY_CARE_PROVIDER_SITE_OTHER): Payer: BLUE CROSS/BLUE SHIELD | Admitting: Family Medicine

## 2016-08-20 VITALS — BP 120/84 | HR 102 | Ht 69.0 in | Wt 180.0 lb

## 2016-08-20 DIAGNOSIS — M2550 Pain in unspecified joint: Secondary | ICD-10-CM | POA: Diagnosis not present

## 2016-08-20 DIAGNOSIS — M899 Disorder of bone, unspecified: Secondary | ICD-10-CM

## 2016-08-20 DIAGNOSIS — M999 Biomechanical lesion, unspecified: Secondary | ICD-10-CM

## 2016-08-20 NOTE — Assessment & Plan Note (Signed)
Stable. Patient's previous workup did not show any autoimmune disease. Patient will continue to be monitored. Patient is doing well with the Effexor. May need to go up to 130 mg at follow-up. Discuss again in 2 months.

## 2016-08-20 NOTE — Assessment & Plan Note (Signed)
Decision today to treat with OMT was based on Physical Exam  After verbal consent patient was treated with HVLA, ME, FPR techniques in cervical, thoracic and rib areas  Patient tolerated the procedure well with improvement in symptoms  Patient given exercises, stretches and lifestyle modifications  See medications in patient instructions if given  Patient will follow up in 8-10 weeks

## 2016-08-20 NOTE — Assessment & Plan Note (Signed)
Encouraged patient to continue to work on posture. We discussed or numbness. Discussed icing regimen. Patient will continue stay active. We will make no change in the Effexor at this moment. Patient come back and see me again in 8-10 weeks.

## 2016-08-20 NOTE — Patient Instructions (Addendum)
great to see you  I am glad everything is going fairly well.  You are doing great  I am glad you get christmas off Keep working on posture and ergonomics  See me again in 8-10 weeks!!

## 2016-08-26 ENCOUNTER — Encounter: Payer: Self-pay | Admitting: Family Medicine

## 2016-08-26 MED ORDER — CYCLOBENZAPRINE HCL 10 MG PO TABS: 10.0000 mg | ORAL_TABLET | Freq: Three times a day (TID) | ORAL | 1 refills | Status: DC | PRN

## 2016-10-12 ENCOUNTER — Other Ambulatory Visit: Payer: Self-pay | Admitting: Family Medicine

## 2016-10-14 NOTE — Telephone Encounter (Signed)
Refill done.  

## 2016-10-15 ENCOUNTER — Ambulatory Visit: Payer: BLUE CROSS/BLUE SHIELD | Admitting: Family Medicine

## 2016-10-15 DIAGNOSIS — R41840 Attention and concentration deficit: Secondary | ICD-10-CM | POA: Diagnosis not present

## 2016-10-15 DIAGNOSIS — F419 Anxiety disorder, unspecified: Secondary | ICD-10-CM | POA: Diagnosis not present

## 2016-10-22 DIAGNOSIS — H5202 Hypermetropia, left eye: Secondary | ICD-10-CM | POA: Diagnosis not present

## 2016-10-29 DIAGNOSIS — F411 Generalized anxiety disorder: Secondary | ICD-10-CM | POA: Diagnosis not present

## 2016-11-05 ENCOUNTER — Encounter: Payer: Self-pay | Admitting: Family Medicine

## 2016-11-20 DIAGNOSIS — F419 Anxiety disorder, unspecified: Secondary | ICD-10-CM | POA: Diagnosis not present

## 2016-11-20 DIAGNOSIS — Z79899 Other long term (current) drug therapy: Secondary | ICD-10-CM | POA: Diagnosis not present

## 2016-11-20 DIAGNOSIS — F429 Obsessive-compulsive disorder, unspecified: Secondary | ICD-10-CM | POA: Diagnosis not present

## 2016-12-11 ENCOUNTER — Other Ambulatory Visit: Payer: Self-pay | Admitting: Family Medicine

## 2016-12-11 ENCOUNTER — Other Ambulatory Visit: Payer: Self-pay

## 2016-12-11 MED ORDER — CYCLOBENZAPRINE HCL 10 MG PO TABS: 10.0000 mg | ORAL_TABLET | Freq: Three times a day (TID) | ORAL | 1 refills | Status: AC | PRN

## 2016-12-11 NOTE — Telephone Encounter (Signed)
refaxed via fax machine

## 2017-01-12 ENCOUNTER — Other Ambulatory Visit: Payer: Self-pay | Admitting: Family Medicine

## 2017-01-17 ENCOUNTER — Other Ambulatory Visit: Payer: Self-pay | Admitting: Family Medicine

## 2017-01-21 DIAGNOSIS — F411 Generalized anxiety disorder: Secondary | ICD-10-CM | POA: Diagnosis not present

## 2017-01-28 ENCOUNTER — Encounter: Payer: BLUE CROSS/BLUE SHIELD | Admitting: Family Medicine

## 2017-02-04 ENCOUNTER — Other Ambulatory Visit (HOSPITAL_COMMUNITY)
Admission: RE | Admit: 2017-02-04 | Discharge: 2017-02-04 | Disposition: A | Discharge status: Home / Self Care | Payer: BLUE CROSS/BLUE SHIELD | Source: Ambulatory Visit | Attending: Family Medicine | Admitting: Family Medicine

## 2017-02-04 ENCOUNTER — Encounter: Payer: Self-pay | Admitting: Family Medicine

## 2017-02-04 ENCOUNTER — Ambulatory Visit (INDEPENDENT_AMBULATORY_CARE_PROVIDER_SITE_OTHER): Payer: BLUE CROSS/BLUE SHIELD | Admitting: Family Medicine

## 2017-02-04 VITALS — BP 114/76 | Temp 98.5°F | Ht 68.25 in | Wt 183.0 lb

## 2017-02-04 DIAGNOSIS — Z87898 Personal history of other specified conditions: Secondary | ICD-10-CM | POA: Diagnosis not present

## 2017-02-04 DIAGNOSIS — Z0000 Encounter for general adult medical examination without abnormal findings: Secondary | ICD-10-CM

## 2017-02-04 DIAGNOSIS — Z01419 Encounter for gynecological examination (general) (routine) without abnormal findings: Secondary | ICD-10-CM | POA: Insufficient documentation

## 2017-02-04 DIAGNOSIS — Z8742 Personal history of other diseases of the female genital tract: Secondary | ICD-10-CM

## 2017-02-04 DIAGNOSIS — Z111 Encounter for screening for respiratory tuberculosis: Secondary | ICD-10-CM | POA: Diagnosis not present

## 2017-02-04 DIAGNOSIS — N938 Other specified abnormal uterine and vaginal bleeding: Secondary | ICD-10-CM

## 2017-02-04 LAB — CBC WITH DIFFERENTIAL/PLATELET
BASOS ABS: 0.1 10*3/uL (ref 0.0–0.1)
Basophils Relative: 0.5 % (ref 0.0–3.0)
EOS ABS: 0.1 10*3/uL (ref 0.0–0.7)
EOS PCT: 0.6 % (ref 0.0–5.0)
HCT: 40.4 % (ref 36.0–46.0)
HEMOGLOBIN: 13.5 g/dL (ref 12.0–15.0)
LYMPHS ABS: 2.4 10*3/uL (ref 0.7–4.0)
Lymphocytes Relative: 23 % (ref 12.0–46.0)
MCHC: 33.3 g/dL (ref 30.0–36.0)
MCV: 90.5 fl (ref 78.0–100.0)
MONO ABS: 0.5 10*3/uL (ref 0.1–1.0)
Monocytes Relative: 4.5 % (ref 3.0–12.0)
NEUTROS PCT: 71.4 % (ref 43.0–77.0)
Neutro Abs: 7.4 10*3/uL (ref 1.4–7.7)
Platelets: 274 10*3/uL (ref 150.0–400.0)
RBC: 4.47 Mil/uL (ref 3.87–5.11)
RDW: 13 % (ref 11.5–15.5)
WBC: 10.4 10*3/uL (ref 4.0–10.5)

## 2017-02-04 LAB — POCT URINALYSIS DIPSTICK
Bilirubin, UA: NEGATIVE
Glucose, UA: NEGATIVE
KETONES UA: NEGATIVE
Nitrite, UA: NEGATIVE
PH UA: 6.5 (ref 5.0–8.0)
PROTEIN UA: NEGATIVE
UROBILINOGEN UA: 0.2 U/dL

## 2017-02-04 MED ORDER — NORGESTREL-ETHINYL ESTRADIOL 0.3-30 MG-MCG PO TABS: 1.0000 | ORAL_TABLET | Freq: Every day | ORAL | 4 refills | Status: AC

## 2017-02-04 MED ORDER — ONDANSETRON HCL 4 MG PO TABS: 4.0000 mg | ORAL_TABLET | Freq: Three times a day (TID) | ORAL | 0 refills | Status: DC | PRN

## 2017-02-04 NOTE — Patient Instructions (Signed)
Set up an appointment next Tuesday at 11:30 for removal of the lesion we discussed

## 2017-02-04 NOTE — Progress Notes (Signed)
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 Johnnye Sima. She's also been seen the psychiatrist because of a history of mild depression. With the above medication she is doing very well she is on an emotional even keel now.  She's been seen Dr. Tamala Julian for evaluation of pain in her wrist. She was describing the emergency room however that typing caused severe tendinitis in both wrists. She's had injections and he has her on Neurontin 200 mg at bedtime now  She uses BCPs. LMP a week ago normal. She did have some abnormal Paps in the past therefore [facility] Fitzgerald Pap smear today.  Family history pertinent her father had a melanoma. Kristie Fitzgerald has a lesion on her right buttock that she thinks is changed color and would like that checked.  Vaccinations up-to-date  14 point review of systems otherwise negative  BP 114/76 (BP Location: Right Arm)   Temp 98.5 F (36.9 C) (Oral)   Ht 5' 8.25" (1.734 m)   Wt 183 lb (83 kg)   LMP 01/19/2017 (Within Days)   BMI 27.62 kg/m  Examination HEENT were negative neck was supple thyroid not enlarged cardiopulmonary exam normal breast exam normal Dahms exam normal pelvic examination external genitalia within normal limits vaginal vault was normal cervix his last Pap smear was done bimanual exam normal extremities normal skin no peripheral pulses normal except for dark lesion on her right buttocks. We'll have her come back next week to remove that.  #1 healthy female  #2 Dub............ continue BCPs  #3 history of mild anxiety and depression........ continue meds as noted above  #4 adult ADD....... continue Adderall at the direction of Dr. Johnnye Sima  #5 family history of melanoma... Father........ abnormal appearing lesion right buttocks.......Kristie Fitzgerald  return for removal..

## 2017-02-05 LAB — URINE CULTURE: Organism ID, Bacteria: NO GROWTH

## 2017-02-06 LAB — CYTOLOGY - PAP
DIAGNOSIS: NEGATIVE
HPV: NOT DETECTED

## 2017-02-11 ENCOUNTER — Encounter: Payer: Self-pay | Admitting: Family Medicine

## 2017-02-11 ENCOUNTER — Ambulatory Visit (INDEPENDENT_AMBULATORY_CARE_PROVIDER_SITE_OTHER): Payer: BLUE CROSS/BLUE SHIELD | Admitting: Family Medicine

## 2017-02-11 VITALS — BP 116/76 | Temp 98.3°F | Wt 185.0 lb

## 2017-02-11 DIAGNOSIS — D225 Melanocytic nevi of trunk: Secondary | ICD-10-CM | POA: Diagnosis not present

## 2017-02-11 DIAGNOSIS — D2371 Other benign neoplasm of skin of right lower limb, including hip: Secondary | ICD-10-CM

## 2017-02-11 DIAGNOSIS — L905 Scar conditions and fibrosis of skin: Secondary | ICD-10-CM | POA: Diagnosis not present

## 2017-02-11 NOTE — Patient Instructions (Signed)
Remove the Band-Aid tomorrow  Within 2 weeks we will call you the report,,,,,,,,,,,, if we do not call you within 2 weeks call us

## 2017-02-11 NOTE — Progress Notes (Signed)
Kristie Fitzgerald is a 26 year old single female nonsmoker comes in today for move of a mole  The lesion in question is 8 mm's by 8 mm's left buttock. It's grown and has developed dark pigmentation over the last couple months  BP 116/76   Temp 98.3 F (36.8 C) (Oral)   Wt 185 lb (83.9 kg)   LMP 01/19/2017 (Within Days)   BMI 27.92 kg/m    After informed consent lesion was cleaned with alcohol anesthetized with 1% Xylocaine with epinephrine and removed with 2 mm margins. The base was cauterized. The lesions were sent for pathologic analysis. She tolerated procedure no complication  #1 dysplastic nevus........ path pending

## 2017-02-14 DIAGNOSIS — Z79899 Other long term (current) drug therapy: Secondary | ICD-10-CM | POA: Diagnosis not present

## 2017-02-14 DIAGNOSIS — F902 Attention-deficit hyperactivity disorder, combined type: Secondary | ICD-10-CM | POA: Diagnosis not present

## 2017-02-14 DIAGNOSIS — F419 Anxiety disorder, unspecified: Secondary | ICD-10-CM | POA: Diagnosis not present

## 2017-02-14 DIAGNOSIS — F1920 Other psychoactive substance dependence, uncomplicated: Secondary | ICD-10-CM | POA: Diagnosis not present

## 2017-03-13 ENCOUNTER — Telehealth: Payer: Self-pay | Admitting: Family Medicine

## 2017-03-13 NOTE — Telephone Encounter (Signed)
° ° ° °  Pt need something that say she TBT was negative

## 2017-03-14 ENCOUNTER — Encounter: Payer: Self-pay | Admitting: Family Medicine

## 2017-03-14 LAB — TB SKIN TEST: TB SKIN TEST: NEGATIVE

## 2017-03-14 NOTE — Telephone Encounter (Signed)
Placed a letter at the front desk.  Left a message on identified voicemail informing the pt that the letter is ready for pick up.  Call back if any questions.

## 2017-04-11 ENCOUNTER — Other Ambulatory Visit: Payer: Self-pay | Admitting: Family Medicine

## 2017-04-13 NOTE — Progress Notes (Signed)
[facility] [place], [place] [address] Phone: [phone] Subjective:    I'm seeing this patient by the request  of:    CC: Back, neck, wrist pain  [id number]  Kristie Fitzgerald is a 26 y.o. female coming in with complaint of multiple pains. Patient has had this for some time. Was started on Effexor.Patient is still taking indomethacin with no lifting intermittently. Patient states that she is having increasing pain. More stress. 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 Patient is in the middle of a move. Patient has been taking a muscle relaxer on a more regular basis.      Past Medical History:  Diagnosis Date  . ADD (attention deficit disorder)   . Anxiety   . Chronic tonsillitis 06/2015  . Dental crown present   . Frequent UTI   . Seasonal allergies   . Tonsillar calculus 06/2015   Past Surgical History:  Procedure Laterality Date  . MASS EXCISION N/A 03/22/2013   Procedure: EXCISION MASS;  Surgeon: Rolm Bookbinder, MD;  Location: [facility];  Service: General;  Laterality: N/A;  . TONSILLECTOMY N/A 07/03/2015   Procedure: TONSILLECTOMY;  Surgeon: Izora Gala, MD;  Location: [facility];  Service: ENT;  Laterality: N/A;  . WISDOM TOOTH EXTRACTION     Social History   Social History  . Marital status: Single    Spouse name: N/A  . Number of children: N/A  . Years of education: N/A   Social History Main Topics  . Smoking status: Never Smoker  . Smokeless tobacco: Never Used  . Alcohol use Yes     Comment: socially  . Drug use: No  . Sexual activity: Yes    Partners: Male    Birth control/ protection: Condom, Pill   Other Topics Concern  . None   Social History Narrative  . None   No Known Allergies Family History  Problem Relation Age of Onset  . Cancer Father        melanoma     Past medical history, social, surgical and family history all reviewed in electronic medical record.  No  pertanent information unless stated regarding to the chief complaint.   Review of Systems: No headache, visual changes, nausea, vomiting, diarrhea, constipation, dizziness, abdominal pain, skin rash, fevers, chills, night sweats, weight loss, swollen lymph nodes, body aches, joint swelling,, chest pain, shortness of breath, mood changes.  Positive muscle aches  Objective  Blood pressure 112/72, pulse (!) 101, height 5\' 9"  (1.753 m), weight 191 lb (86.6 kg), SpO2 97 %.   Systems examined below as of 04/14/17 General: NAD A&O x3 mood, affect normal  HEENT: Pupils equal, extraocular movements intact no nystagmus Respiratory: not short of breath at rest or with speaking Cardiovascular: No lower extremity edema, non tender Skin: Warm dry intact with no signs of infection or rash on extremities or on axial skeleton. Abdomen: Soft nontender, no masses Neuro: Cranial nerves  intact, neurovascularly intact in all extremities with 2+ DTRs and 2+ pulses. Lymph: No lymphadenopathy appreciated today  Gait normal with good balance and coordination.  MSK: Non tender with full range of motion and good stability and symmetric strength and tone of shoulders, elbows, wrist,  knee hips and ankles bilaterally.    Neck: Inspection unremarkable. No palpable stepoffs. Negative Spurling's maneuver. Full neck range of motion Grip strength and sensation normal in bilateral hands Strength good C4 to T1 distribution No sensory change to C4 to  T1 Negative Hoffman sign bilaterally Reflexes normal Significant tightness of the trapezius bilaterally record of the left  Osteopathic findings C2 flexed rotated and side bent right C4 flexed rotated and side bent left C6 flexed rotated and side bent right  T4 extended rotated and side bent right inhaled rib L2 flexed rotated and side bent right Sacrum right on right    Impression and Recommendations:     This case required medical decision making of moderate  complexity.      Note: This dictation was prepared with Dragon dictation along with smaller phrase technology. Any transcriptional errors that result from this process are unintentional.

## 2017-04-14 ENCOUNTER — Ambulatory Visit (INDEPENDENT_AMBULATORY_CARE_PROVIDER_SITE_OTHER): Payer: BLUE CROSS/BLUE SHIELD | Admitting: Family Medicine

## 2017-04-14 ENCOUNTER — Encounter: Payer: Self-pay | Admitting: Family Medicine

## 2017-04-14 DIAGNOSIS — M542 Cervicalgia: Secondary | ICD-10-CM

## 2017-04-14 DIAGNOSIS — M999 Biomechanical lesion, unspecified: Secondary | ICD-10-CM

## 2017-04-14 MED ORDER — NABUMETONE 750 MG PO TABS: 750.0000 mg | ORAL_TABLET | Freq: Every day | ORAL | 3 refills | Status: AC

## 2017-04-14 NOTE — Assessment & Plan Note (Signed)
Decision today to treat with OMT was based on Physical Exam  After verbal consent patient was treated with HVLA, ME, FPR techniques in cervical, thoracic, rib lumbar and sacral areas  Patient tolerated the procedure well with improvement in symptoms  Patient given exercises, stretches and lifestyle modifications  See medications in patient instructions if given  Patient will follow up in 4 weeks 

## 2017-04-14 NOTE — Assessment & Plan Note (Signed)
Positional in stress and is. We discussed as regimen and home exercises. Increase activity as tolerated. Patient will follow-up with me again in 4 weeks.

## 2017-04-14 NOTE — Patient Instructions (Signed)
Good to see you  Relafen 2 times a day as needed Flexeril only if really needed Tart cherry extract any dose at night may help with pain and sleep  See you after the move in 4 weeks!

## 2017-04-17 DIAGNOSIS — Z79899 Other long term (current) drug therapy: Secondary | ICD-10-CM | POA: Diagnosis not present

## 2017-04-17 DIAGNOSIS — F902 Attention-deficit hyperactivity disorder, combined type: Secondary | ICD-10-CM | POA: Diagnosis not present

## 2017-04-17 DIAGNOSIS — F419 Anxiety disorder, unspecified: Secondary | ICD-10-CM | POA: Diagnosis not present

## 2017-04-21 DIAGNOSIS — F330 Major depressive disorder, recurrent, mild: Secondary | ICD-10-CM | POA: Diagnosis not present

## 2017-04-21 DIAGNOSIS — F411 Generalized anxiety disorder: Secondary | ICD-10-CM | POA: Diagnosis not present

## 2017-04-23 ENCOUNTER — Ambulatory Visit: Payer: Self-pay | Admitting: Family Medicine

## 2017-04-28 ENCOUNTER — Encounter: Payer: Self-pay | Admitting: Family Medicine

## 2017-04-28 DIAGNOSIS — Z23 Encounter for immunization: Secondary | ICD-10-CM

## 2017-05-02 ENCOUNTER — Ambulatory Visit (INDEPENDENT_AMBULATORY_CARE_PROVIDER_SITE_OTHER): Payer: BLUE CROSS/BLUE SHIELD | Admitting: Family Medicine

## 2017-05-02 ENCOUNTER — Other Ambulatory Visit: Payer: BLUE CROSS/BLUE SHIELD

## 2017-05-02 ENCOUNTER — Encounter: Payer: Self-pay | Admitting: Family Medicine

## 2017-05-02 VITALS — BP 120/90 | HR 108 | Ht 69.0 in | Wt 190.0 lb

## 2017-05-02 DIAGNOSIS — M545 Low back pain: Secondary | ICD-10-CM | POA: Diagnosis not present

## 2017-05-02 DIAGNOSIS — Z23 Encounter for immunization: Secondary | ICD-10-CM

## 2017-05-02 DIAGNOSIS — M999 Biomechanical lesion, unspecified: Secondary | ICD-10-CM | POA: Diagnosis not present

## 2017-05-02 DIAGNOSIS — M542 Cervicalgia: Secondary | ICD-10-CM | POA: Diagnosis not present

## 2017-05-02 NOTE — Patient Instructions (Addendum)
God to see you  Kristie Fitzgerald is your friend.  Continue exercises Stay active.  Adjustable standing desk  See me again in 6 weeks.

## 2017-05-02 NOTE — Progress Notes (Signed)
[facility] [place], [place] [address] Phone: [phone] Subjective:    I'm seeing this patient by the request  of:    CC: Back, neck,   [id number]  Kristie Fitzgerald is a 26 y.o. female coming in with complaint of multiple pains. Patient has had this for some time. Was started on Effexor.Patient is doing relatively well at this time. Discussed with patient again a great length. Patient has been doing the exercises intermittently. Patient did move a lot of boxes recently with her start school again. This is causing some increase in his lower back discomfort.      Past Medical History:  Diagnosis Date  . ADD (attention deficit disorder)   . Anxiety   . Chronic tonsillitis 06/2015  . Dental crown present   . Frequent UTI   . Seasonal allergies   . Tonsillar calculus 06/2015   Past Surgical History:  Procedure Laterality Date  . MASS EXCISION N/A 03/22/2013   Procedure: EXCISION MASS;  Surgeon: Rolm Bookbinder, MD;  Location: [facility];  Service: General;  Laterality: N/A;  . TONSILLECTOMY N/A 07/03/2015   Procedure: TONSILLECTOMY;  Surgeon: Izora Gala, MD;  Location: [facility];  Service: ENT;  Laterality: N/A;  . WISDOM TOOTH EXTRACTION     Social History   Social History  . Marital status: Single    Spouse name: N/A  . Number of children: N/A  . Years of education: N/A   Social History Main Topics  . Smoking status: Never Smoker  . Smokeless tobacco: Never Used  . Alcohol use Yes     Comment: socially  . Drug use: No  . Sexual activity: Yes    Partners: Male    Birth control/ protection: Condom, Pill   Other Topics Concern  . None   Social History Narrative  . None   No Known Allergies Family History  Problem Relation Age of Onset  . Cancer Father        melanoma     Past medical history, social, surgical and family history all reviewed in electronic medical record.  No pertanent  information unless stated regarding to the chief complaint.   Review of Systems: No headache, visual changes, nausea, vomiting, diarrhea, constipation, dizziness, abdominal pain, skin rash, fevers, chills, night sweats, weight loss, swollen lymph nodes, body aches, joint swelling, chest pain, shortness of breath, mood changes.  Positive muscle aches  Objective  Blood pressure 120/90, pulse (!) 108, height 5\' 9"  (1.753 m), weight 190 lb (86.2 kg).   General: NAD A&O x3 mood, affect normal  HEENT: Pupils equal, extraocular movements intact no nystagmus Respiratory: not short of breath at rest or with speaking Cardiovascular: No lower extremity edema, non tender Skin: Warm dry intact with no signs of infection or rash on extremities or on axial skeleton. Abdomen: Soft nontender, no masses Neuro: Cranial nerves  intact, neurovascularly intact in all extremities with 2+ DTRs and 2+ pulses. Lymph: No lymphadenopathy appreciated today  Gait normal with good balance and coordination.  MSK: Non tender with full range of motion and good stability and symmetric strength and tone of shoulders, elbows, wrist,  knee hips and ankles bilaterally.    Neck: Inspection unremarkable. No palpable stepoffs. Negative Spurling's maneuver. Full neck range of motion Grip strength and sensation normal in bilateral hands Strength good C4 to T1 distribution No sensory change to C4 to T1 Negative Hoffman sign bilaterally Reflexes normal Tightness in the  left trapezius noted again today  Low back shows the patient does have tenderness over the right sacroiliac joint. Full range of motion. Patient does have a positive Tour manager.  Osteopathic findings C2 flexed rotated and side bent right C4 flexed rotated and side bent left C6 flexed rotated and side bent left T3 extended rotated and side bent right inhaled third rib T9 extended rotated and side bent left L2 flexed rotated and side bent right Sacrum right  on right Pelvic shear noted    Impression and Recommendations:     This case required medical decision making of moderate complexity.      Note: This dictation was prepared with Dragon dictation along with smaller phrase technology. Any transcriptional errors that result from this process are unintentional.

## 2017-05-03 DIAGNOSIS — M545 Low back pain, unspecified: Secondary | ICD-10-CM | POA: Insufficient documentation

## 2017-05-03 NOTE — Assessment & Plan Note (Signed)
Has had difficulty with this. Some increasing lower back pain since patient was moving boxes. Patient did have a pelvic shear which is new today. We discussed icing regimen, core strengthening and proper lifting mechanics. Patient was unable to tolerate certain medications but does have that the muscle relaxer seems to be helping as well as the oral anti-inflammatories. Patient will continue to be active. We'll see me again in 6-8 weeks

## 2017-05-03 NOTE — Assessment & Plan Note (Addendum)
Decision today to treat with OMT was based on Physical Exam  After verbal consent patient was treated with HVLA, ME, FPR techniques in cervical, rib thoracic, lumbar and sacral areas  Patient tolerated the procedure well with improvement in symptoms  Patient given exercises, stretches and lifestyle modifications  See medications in patient instructions if given  Patient will follow up in 6-8 weeks

## 2017-05-05 LAB — VARICELLA ZOSTER ABS, IGG/IGM: VARICELLA: 1228 {index} (ref >165)

## 2017-05-05 NOTE — Progress Notes (Unsigned)
You are good!

## 2017-06-13 ENCOUNTER — Encounter: Payer: Self-pay | Admitting: Family Medicine

## 2017-06-18 DIAGNOSIS — F411 Generalized anxiety disorder: Secondary | ICD-10-CM | POA: Diagnosis not present

## 2017-06-21 DIAGNOSIS — L03116 Cellulitis of left lower limb: Secondary | ICD-10-CM | POA: Diagnosis not present

## 2017-07-04 ENCOUNTER — Encounter: Payer: Self-pay | Admitting: Family Medicine

## 2017-07-07 DIAGNOSIS — F902 Attention-deficit hyperactivity disorder, combined type: Secondary | ICD-10-CM | POA: Diagnosis not present

## 2017-07-07 DIAGNOSIS — Z79899 Other long term (current) drug therapy: Secondary | ICD-10-CM | POA: Diagnosis not present

## 2017-07-07 DIAGNOSIS — F419 Anxiety disorder, unspecified: Secondary | ICD-10-CM | POA: Diagnosis not present

## 2017-07-08 ENCOUNTER — Ambulatory Visit (INDEPENDENT_AMBULATORY_CARE_PROVIDER_SITE_OTHER): Payer: BLUE CROSS/BLUE SHIELD | Admitting: Family Medicine

## 2017-07-08 ENCOUNTER — Encounter: Payer: Self-pay | Admitting: Family Medicine

## 2017-07-08 VITALS — BP 102/78 | HR 100 | Temp 98.3°F | Wt 184.0 lb

## 2017-07-08 DIAGNOSIS — L600 Ingrowing nail: Secondary | ICD-10-CM | POA: Insufficient documentation

## 2017-07-08 MED ORDER — ONDANSETRON HCL 4 MG PO TABS: 4.0000 mg | ORAL_TABLET | Freq: Three times a day (TID) | ORAL | 0 refills | Status: AC | PRN

## 2017-07-08 MED ORDER — MOXIFLOXACIN HCL 400 MG PO TABS: 400.0000 mg | ORAL_TABLET | Freq: Every day | ORAL | 0 refills | Status: AC

## 2017-07-08 NOTE — Progress Notes (Signed)
Kristie Fitzgerald is a 26 year old single female nonsmoker who comes in today for evaluation of a ingrown toenail,,,,,,,,,,, with secondary infection left great toe nail.  It's been this way for about 2 weeks. She cut out part of the nail herself. She's been using the warm soaks antibiotics and Keflex 500 mg twice a day. It's gotten better but hasn't completely resolved.  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 There is erythema but no pus  #1 cellulitis left great toe secondary to ingrown toenail........ warm soaks twice a day..... Therapeutic dose of Avelox..... Return when necessary

## 2017-07-08 NOTE — Patient Instructions (Signed)
Warm soaks twice daily  Apply antibiotic ointment and a Band-Aid  Avelox 400 mg,,,,,,,,,, 1 daily for 7 days  Return when necessary  Zofran 4 mg,,,,,,,,,, one every 8 hours when necessary for nausea  Let the right great toenail grow out and begin packing with cotton at bedtime as we discussed

## 2017-07-16 ENCOUNTER — Other Ambulatory Visit: Payer: Self-pay | Admitting: Family Medicine

## 2017-07-16 NOTE — Telephone Encounter (Signed)
Refill done.  

## 2017-09-08 DIAGNOSIS — F419 Anxiety disorder, unspecified: Secondary | ICD-10-CM | POA: Diagnosis not present

## 2017-09-08 DIAGNOSIS — Z79899 Other long term (current) drug therapy: Secondary | ICD-10-CM | POA: Diagnosis not present

## 2017-09-08 DIAGNOSIS — F902 Attention-deficit hyperactivity disorder, combined type: Secondary | ICD-10-CM | POA: Diagnosis not present

## 2017-10-02 DIAGNOSIS — F411 Generalized anxiety disorder: Secondary | ICD-10-CM | POA: Diagnosis not present

## 2017-10-23 ENCOUNTER — Encounter: Payer: Self-pay | Admitting: Family Medicine

## 2017-10-28 ENCOUNTER — Other Ambulatory Visit: Payer: Self-pay

## 2017-10-28 DIAGNOSIS — F32A Depression, unspecified: Secondary | ICD-10-CM

## 2017-10-28 DIAGNOSIS — F329 Major depressive disorder, single episode, unspecified: Secondary | ICD-10-CM

## 2017-10-28 MED ORDER — NITROFURANTOIN MONOHYD MACRO 100 MG PO CAPS: ORAL_CAPSULE | ORAL | 0 refills | Status: AC

## 2017-11-24 DIAGNOSIS — F331 Major depressive disorder, recurrent, moderate: Secondary | ICD-10-CM | POA: Diagnosis not present

## 2017-11-24 DIAGNOSIS — F900 Attention-deficit hyperactivity disorder, predominantly inattentive type: Secondary | ICD-10-CM | POA: Diagnosis not present

## 2017-11-24 DIAGNOSIS — F411 Generalized anxiety disorder: Secondary | ICD-10-CM | POA: Diagnosis not present

## 2017-11-25 DIAGNOSIS — F902 Attention-deficit hyperactivity disorder, combined type: Secondary | ICD-10-CM | POA: Diagnosis not present

## 2017-11-25 DIAGNOSIS — F419 Anxiety disorder, unspecified: Secondary | ICD-10-CM | POA: Diagnosis not present

## 2017-11-25 DIAGNOSIS — Z79899 Other long term (current) drug therapy: Secondary | ICD-10-CM | POA: Diagnosis not present

## 2018-01-07 IMAGING — US US PELVIS COMPLETE
1 series · 14 of 25 positions shown · non-contrast
Comparison: CT abdomen and pelvis 03/15/2016

CLINICAL DATA: RIGHT lower quadrant abdominal pain

EXAM:
TRANSABDOMINAL AND TRANSVAGINAL ULTRASOUND OF PELVIS
TECHNIQUE: Both transabdominal and transvaginal ultrasound examinations of the
pelvis were performed. Transabdominal technique was performed for
global imaging of the pelvis including uterus, ovaries, adnexal
regions, and pelvic cul-de-sac. It was necessary to proceed with
endovaginal exam following the transabdominal exam to visualize the
ovaries and endometrium.

[Series 1: us pelvis complete · 0.30mm/px · 14 of 69 slices shown]
[im 1/69]
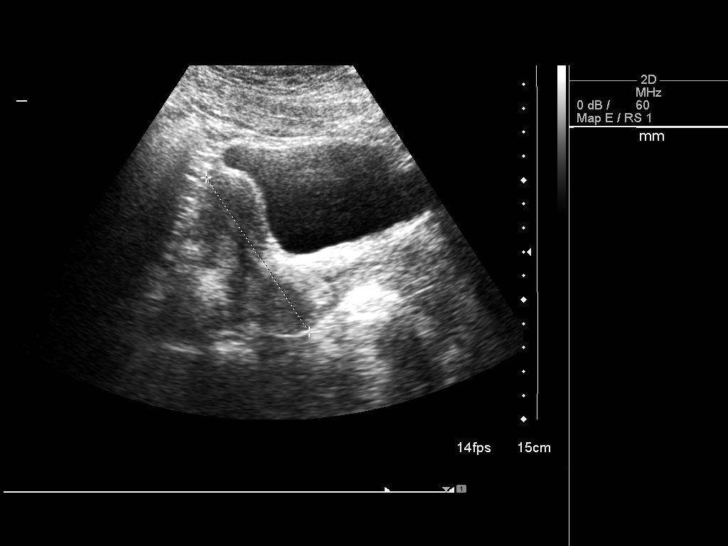
[im 6/69]
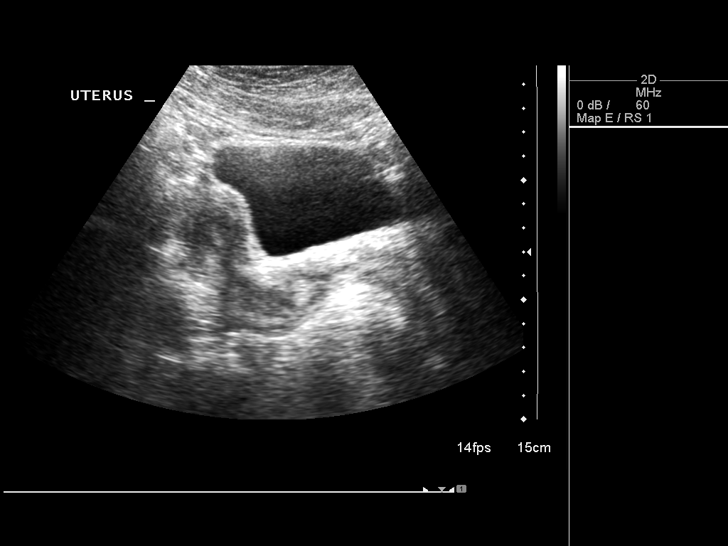
[im 12/69]
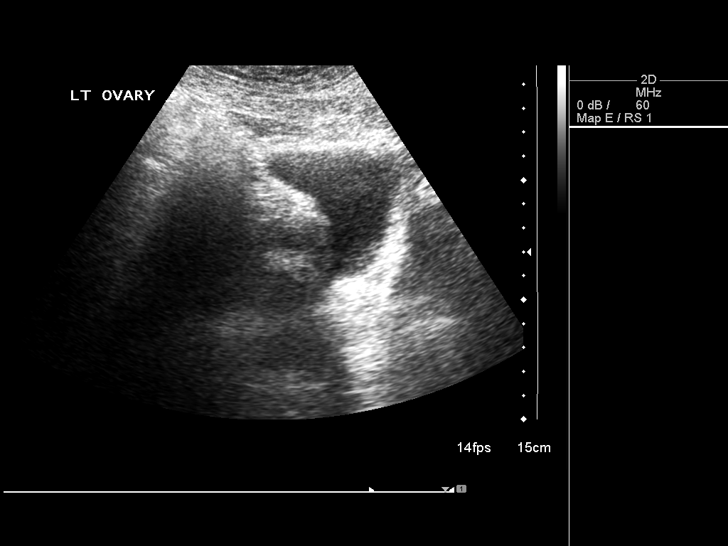
[im 18/69]
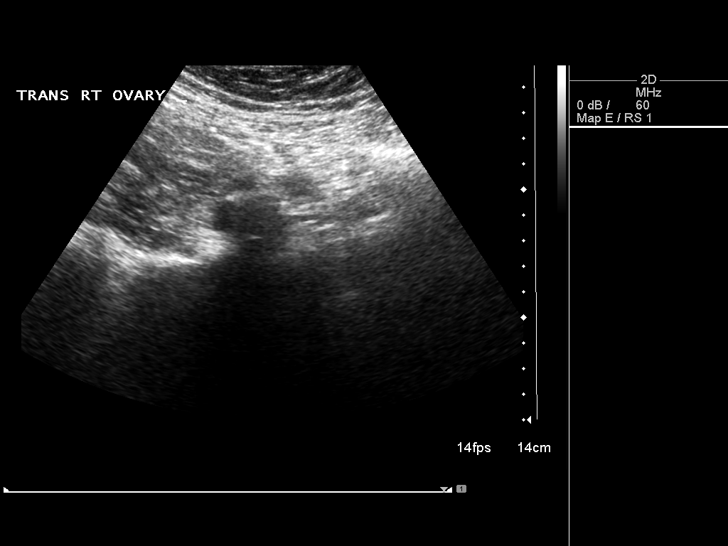
[im 23/69]
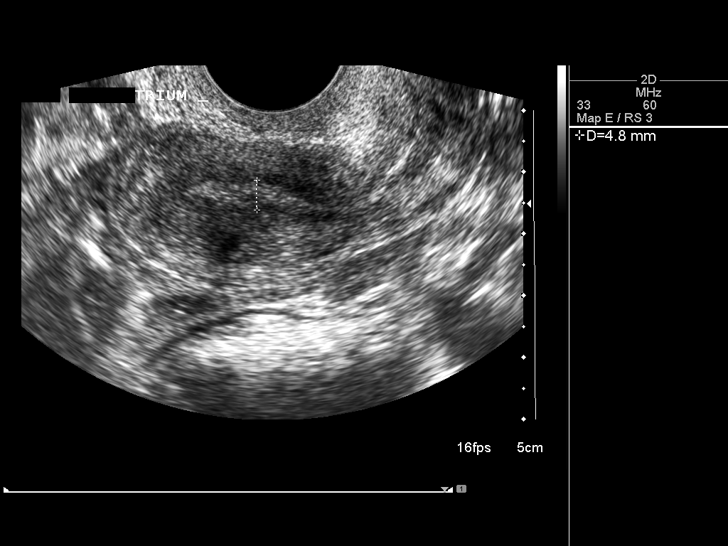
[im 26/69]
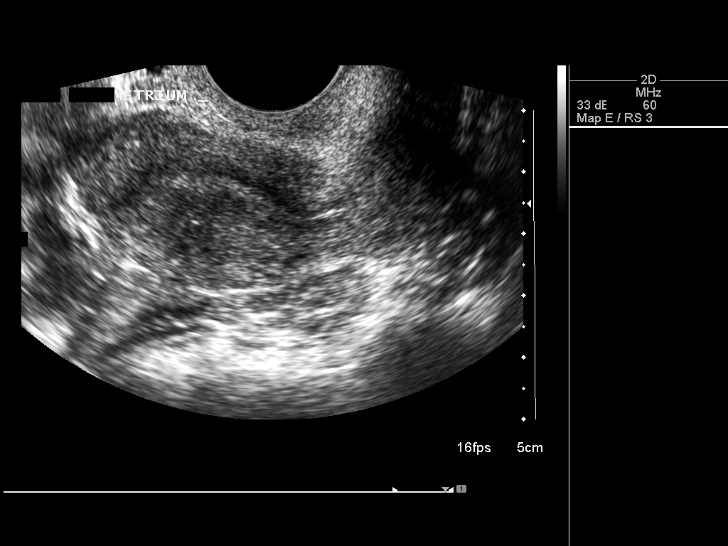
[im 32/69]
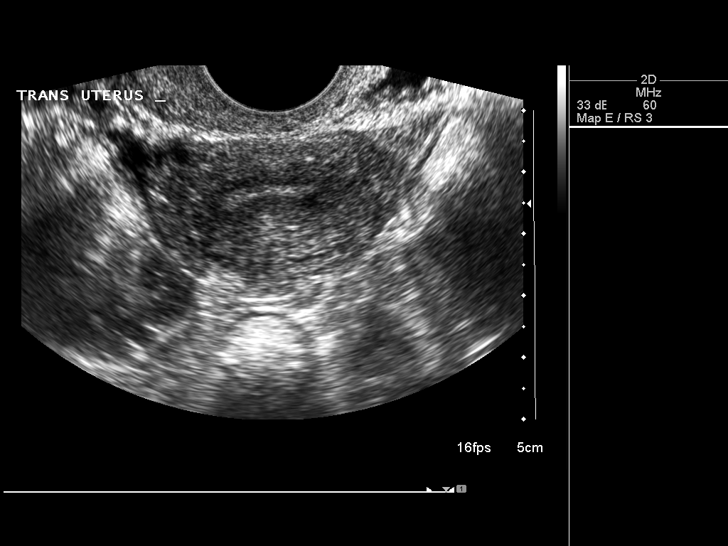
[im 37/69]
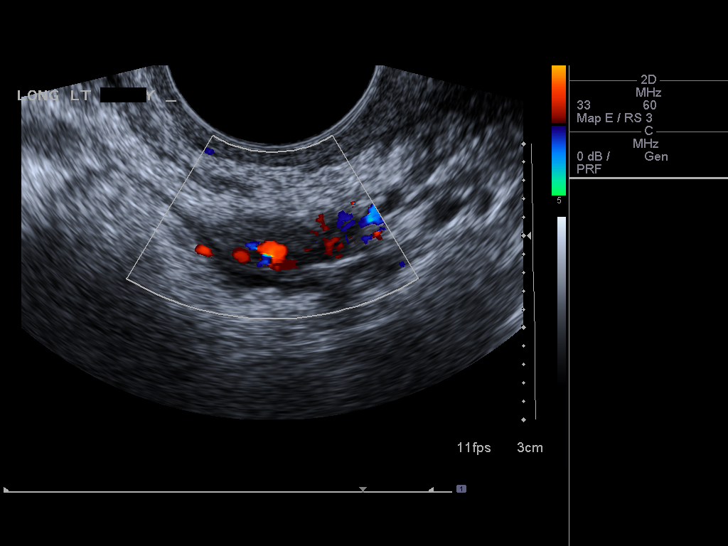
[im 43/69]
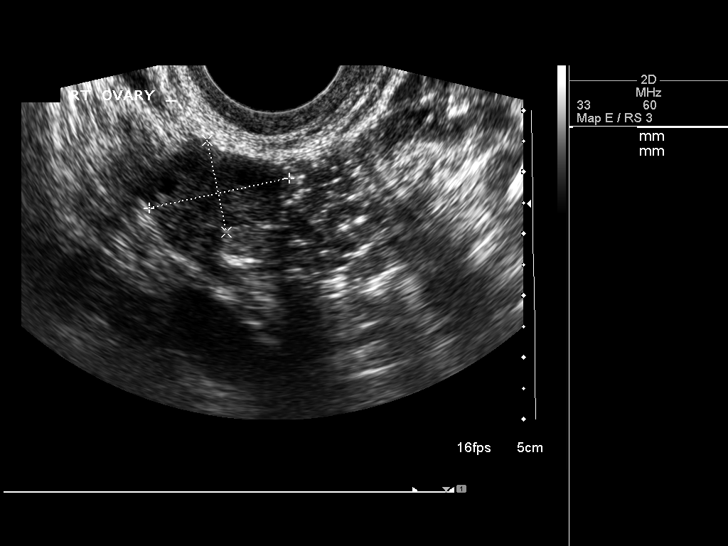
[im 46/69]
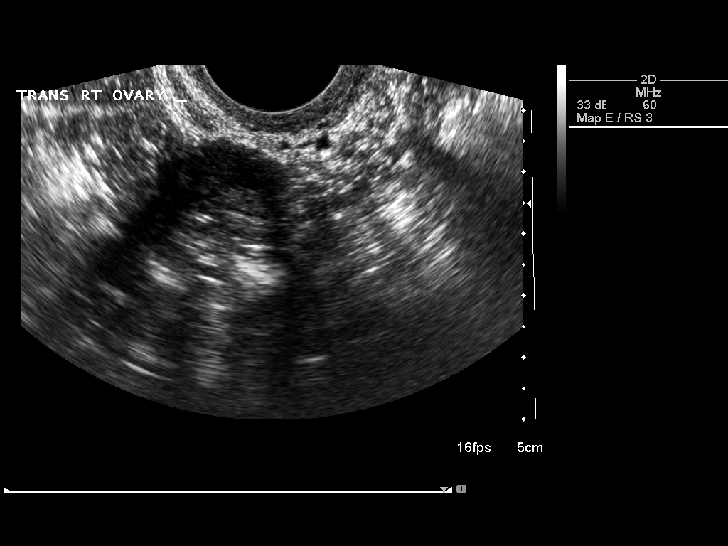
[im 52/69]
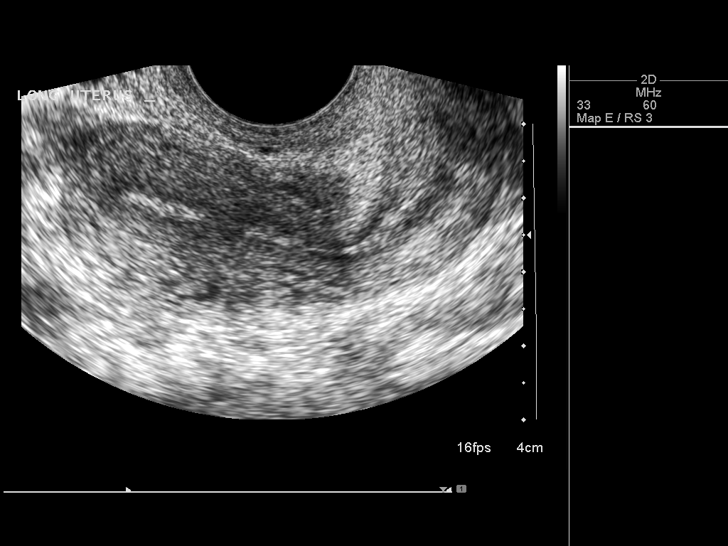
[im 57/69]
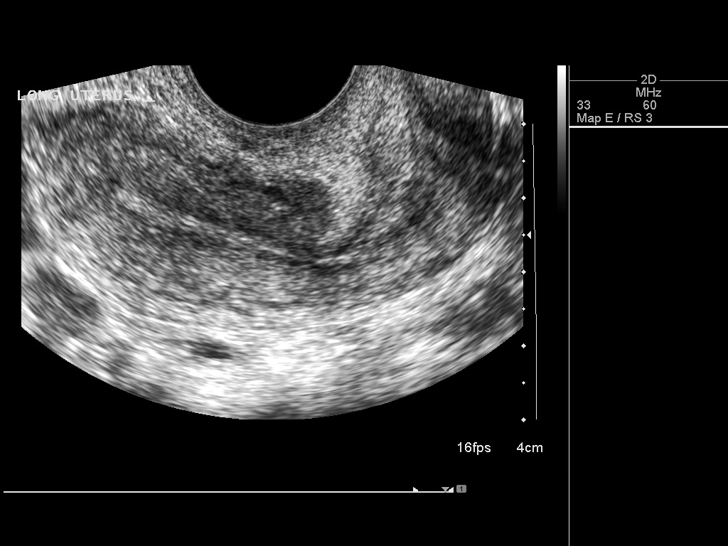
[im 63/69]
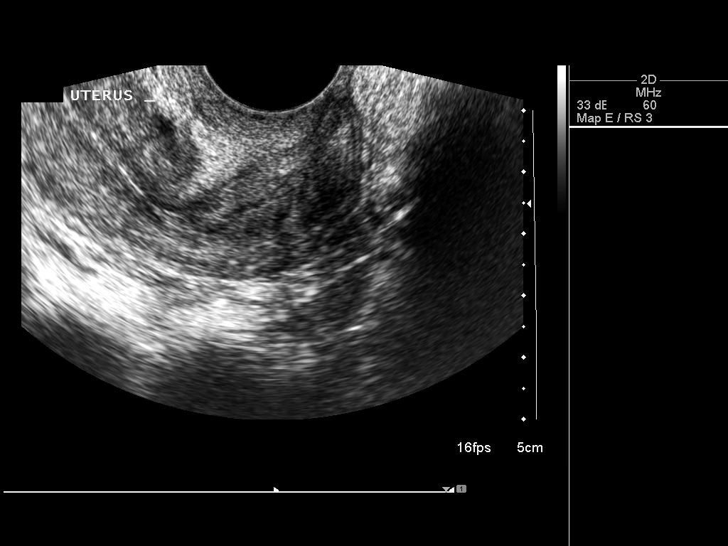
[im 69/69]
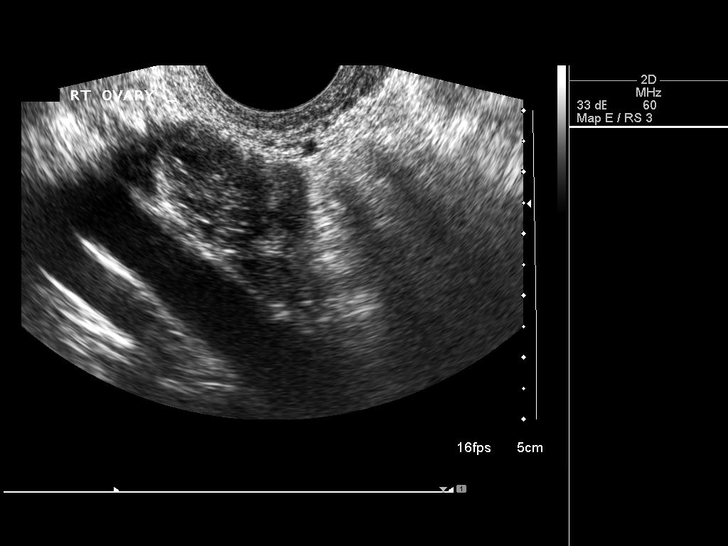

[14 of 25 positions shown; findings below may reference images not displayed]

FINDINGS: Uterus

Measurements: 7.8 x 2.4 x 4.1 cm. Normal morphology without mass.

Endometrium

Thickness: 5 mm thick, normal. No endometrial fluid or focal
abnormality.

Right ovary

Measurements: 2.3 x 1.5 x 2.3 cm. Normal morphology without mass.

Left ovary

Measurements: 1.8 x 0.6 x 1.4 cm. Normal morphology without mass.

Other findings

No free pelvic fluid or adnexal masses.
IMPRESSION: Normal exam.

## 2018-01-11 ENCOUNTER — Encounter: Payer: Self-pay | Admitting: Family Medicine

## 2018-02-12 DIAGNOSIS — Z79899 Other long term (current) drug therapy: Secondary | ICD-10-CM | POA: Diagnosis not present

## 2018-02-12 DIAGNOSIS — F419 Anxiety disorder, unspecified: Secondary | ICD-10-CM | POA: Diagnosis not present

## 2018-02-12 DIAGNOSIS — F902 Attention-deficit hyperactivity disorder, combined type: Secondary | ICD-10-CM | POA: Diagnosis not present

## 2018-02-24 DIAGNOSIS — F411 Generalized anxiety disorder: Secondary | ICD-10-CM | POA: Diagnosis not present

## 2018-02-24 DIAGNOSIS — F900 Attention-deficit hyperactivity disorder, predominantly inattentive type: Secondary | ICD-10-CM | POA: Diagnosis not present

## 2018-02-24 DIAGNOSIS — F331 Major depressive disorder, recurrent, moderate: Secondary | ICD-10-CM | POA: Diagnosis not present

## 2018-03-23 DIAGNOSIS — B001 Herpesviral vesicular dermatitis: Secondary | ICD-10-CM | POA: Diagnosis not present

## 2018-03-23 DIAGNOSIS — K120 Recurrent oral aphthae: Secondary | ICD-10-CM | POA: Diagnosis not present

## 2018-05-11 DIAGNOSIS — F331 Major depressive disorder, recurrent, moderate: Secondary | ICD-10-CM | POA: Diagnosis not present

## 2018-05-11 DIAGNOSIS — F411 Generalized anxiety disorder: Secondary | ICD-10-CM | POA: Diagnosis not present

## 2018-05-27 DIAGNOSIS — J309 Allergic rhinitis, unspecified: Secondary | ICD-10-CM | POA: Diagnosis not present

## 2018-05-27 DIAGNOSIS — Z3041 Encounter for surveillance of contraceptive pills: Secondary | ICD-10-CM | POA: Diagnosis not present

## 2018-05-27 DIAGNOSIS — Z111 Encounter for screening for respiratory tuberculosis: Secondary | ICD-10-CM | POA: Diagnosis not present

## 2018-06-01 DIAGNOSIS — Z01419 Encounter for gynecological examination (general) (routine) without abnormal findings: Secondary | ICD-10-CM | POA: Diagnosis not present

## 2018-06-01 DIAGNOSIS — Z0000 Encounter for general adult medical examination without abnormal findings: Secondary | ICD-10-CM | POA: Diagnosis not present

## 2018-06-05 DIAGNOSIS — F902 Attention-deficit hyperactivity disorder, combined type: Secondary | ICD-10-CM | POA: Diagnosis not present

## 2018-06-05 DIAGNOSIS — F419 Anxiety disorder, unspecified: Secondary | ICD-10-CM | POA: Diagnosis not present

## 2018-06-05 DIAGNOSIS — Z79899 Other long term (current) drug therapy: Secondary | ICD-10-CM | POA: Diagnosis not present

## 2018-06-30 DIAGNOSIS — F331 Major depressive disorder, recurrent, moderate: Secondary | ICD-10-CM | POA: Diagnosis not present

## 2018-06-30 DIAGNOSIS — F411 Generalized anxiety disorder: Secondary | ICD-10-CM | POA: Diagnosis not present

## 2018-08-19 DIAGNOSIS — F900 Attention-deficit hyperactivity disorder, predominantly inattentive type: Secondary | ICD-10-CM | POA: Diagnosis not present

## 2018-08-19 DIAGNOSIS — F411 Generalized anxiety disorder: Secondary | ICD-10-CM | POA: Diagnosis not present

## 2018-08-19 DIAGNOSIS — F331 Major depressive disorder, recurrent, moderate: Secondary | ICD-10-CM | POA: Diagnosis not present

## 2018-09-09 DIAGNOSIS — Z79899 Other long term (current) drug therapy: Secondary | ICD-10-CM | POA: Diagnosis not present

## 2018-09-09 DIAGNOSIS — F902 Attention-deficit hyperactivity disorder, combined type: Secondary | ICD-10-CM | POA: Diagnosis not present

## 2018-09-09 DIAGNOSIS — F419 Anxiety disorder, unspecified: Secondary | ICD-10-CM | POA: Diagnosis not present

## 2018-10-15 DIAGNOSIS — F331 Major depressive disorder, recurrent, moderate: Secondary | ICD-10-CM | POA: Diagnosis not present

## 2018-10-15 DIAGNOSIS — F411 Generalized anxiety disorder: Secondary | ICD-10-CM | POA: Diagnosis not present

## 2018-10-15 DIAGNOSIS — F900 Attention-deficit hyperactivity disorder, predominantly inattentive type: Secondary | ICD-10-CM | POA: Diagnosis not present

## 2018-11-20 DIAGNOSIS — F428 Other obsessive-compulsive disorder: Secondary | ICD-10-CM | POA: Diagnosis not present

## 2018-11-20 DIAGNOSIS — G4700 Insomnia, unspecified: Secondary | ICD-10-CM | POA: Diagnosis not present

## 2018-11-20 DIAGNOSIS — F419 Anxiety disorder, unspecified: Secondary | ICD-10-CM | POA: Diagnosis not present

## 2018-11-20 DIAGNOSIS — F901 Attention-deficit hyperactivity disorder, predominantly hyperactive type: Secondary | ICD-10-CM | POA: Diagnosis not present

## 2018-12-22 DIAGNOSIS — F900 Attention-deficit hyperactivity disorder, predominantly inattentive type: Secondary | ICD-10-CM | POA: Diagnosis not present

## 2018-12-22 DIAGNOSIS — F411 Generalized anxiety disorder: Secondary | ICD-10-CM | POA: Diagnosis not present

## 2018-12-22 DIAGNOSIS — F331 Major depressive disorder, recurrent, moderate: Secondary | ICD-10-CM | POA: Diagnosis not present

## 2019-01-04 DIAGNOSIS — F902 Attention-deficit hyperactivity disorder, combined type: Secondary | ICD-10-CM | POA: Diagnosis not present

## 2019-01-04 DIAGNOSIS — Z79899 Other long term (current) drug therapy: Secondary | ICD-10-CM | POA: Diagnosis not present

## 2019-01-12 DIAGNOSIS — S90111A Contusion of right great toe without damage to nail, initial encounter: Secondary | ICD-10-CM | POA: Diagnosis not present

## 2019-01-12 DIAGNOSIS — M79672 Pain in left foot: Secondary | ICD-10-CM | POA: Diagnosis not present

## 2019-01-12 DIAGNOSIS — S90112A Contusion of left great toe without damage to nail, initial encounter: Secondary | ICD-10-CM | POA: Diagnosis not present

## 2019-01-12 DIAGNOSIS — M79671 Pain in right foot: Secondary | ICD-10-CM | POA: Diagnosis not present

## 2019-02-18 DIAGNOSIS — F900 Attention-deficit hyperactivity disorder, predominantly inattentive type: Secondary | ICD-10-CM | POA: Diagnosis not present

## 2019-02-18 DIAGNOSIS — F331 Major depressive disorder, recurrent, moderate: Secondary | ICD-10-CM | POA: Diagnosis not present

## 2019-02-18 DIAGNOSIS — F411 Generalized anxiety disorder: Secondary | ICD-10-CM | POA: Diagnosis not present

## 2019-04-09 DIAGNOSIS — F902 Attention-deficit hyperactivity disorder, combined type: Secondary | ICD-10-CM | POA: Diagnosis not present

## 2019-04-09 DIAGNOSIS — Z79899 Other long term (current) drug therapy: Secondary | ICD-10-CM | POA: Diagnosis not present

## 2019-05-20 DIAGNOSIS — F411 Generalized anxiety disorder: Secondary | ICD-10-CM | POA: Diagnosis not present

## 2019-05-20 DIAGNOSIS — F900 Attention-deficit hyperactivity disorder, predominantly inattentive type: Secondary | ICD-10-CM | POA: Diagnosis not present

## 2019-05-20 DIAGNOSIS — F331 Major depressive disorder, recurrent, moderate: Secondary | ICD-10-CM | POA: Diagnosis not present

## 2019-08-10 DIAGNOSIS — F419 Anxiety disorder, unspecified: Secondary | ICD-10-CM | POA: Diagnosis not present

## 2019-08-10 DIAGNOSIS — Z79899 Other long term (current) drug therapy: Secondary | ICD-10-CM | POA: Diagnosis not present

## 2019-08-10 DIAGNOSIS — F902 Attention-deficit hyperactivity disorder, combined type: Secondary | ICD-10-CM | POA: Diagnosis not present

## 2019-08-17 DIAGNOSIS — F411 Generalized anxiety disorder: Secondary | ICD-10-CM | POA: Diagnosis not present

## 2019-08-17 DIAGNOSIS — F900 Attention-deficit hyperactivity disorder, predominantly inattentive type: Secondary | ICD-10-CM | POA: Diagnosis not present

## 2019-08-17 DIAGNOSIS — F331 Major depressive disorder, recurrent, moderate: Secondary | ICD-10-CM | POA: Diagnosis not present

## 2019-08-27 DIAGNOSIS — Z03818 Encounter for observation for suspected exposure to other biological agents ruled out: Secondary | ICD-10-CM | POA: Diagnosis not present
